# Patient Record
Sex: Female | Born: 1980 | Race: Black or African American | Hispanic: No | Marital: Married | Smoking: Never smoker
Health system: Southern US, Community
[De-identification: ages and names within clinical notes are randomized; demographics above are authoritative.]

## PROBLEM LIST (undated history)

## (undated) DIAGNOSIS — R011 Cardiac murmur, unspecified: Secondary | ICD-10-CM

## (undated) DIAGNOSIS — J302 Other seasonal allergic rhinitis: Secondary | ICD-10-CM

## (undated) HISTORY — DX: Other seasonal allergic rhinitis: J30.2

## (undated) HISTORY — DX: Cardiac murmur, unspecified: R01.1

---

## 1999-05-12 ENCOUNTER — Emergency Department (HOSPITAL_COMMUNITY): Admission: EM | Admit: 1999-05-12 | Discharge: 1999-05-12 | Payer: Self-pay | Admitting: Emergency Medicine

## 1999-05-12 ENCOUNTER — Encounter: Payer: Self-pay | Admitting: Emergency Medicine

## 2000-06-21 ENCOUNTER — Emergency Department (HOSPITAL_COMMUNITY): Admission: EM | Admit: 2000-06-21 | Discharge: 2000-06-21 | Payer: Self-pay | Admitting: Emergency Medicine

## 2000-06-21 ENCOUNTER — Encounter: Payer: Self-pay | Admitting: Emergency Medicine

## 2000-08-17 ENCOUNTER — Other Ambulatory Visit: Admission: RE | Admit: 2000-08-17 | Discharge: 2000-08-17 | Payer: Self-pay

## 2001-07-05 ENCOUNTER — Other Ambulatory Visit: Admission: RE | Admit: 2001-07-05 | Discharge: 2001-07-05 | Payer: Self-pay | Admitting: *Deleted

## 2001-12-16 ENCOUNTER — Emergency Department (HOSPITAL_COMMUNITY): Admission: EM | Admit: 2001-12-16 | Discharge: 2001-12-16 | Payer: Self-pay | Admitting: Emergency Medicine

## 2001-12-16 ENCOUNTER — Encounter: Payer: Self-pay | Admitting: Emergency Medicine

## 2004-01-18 ENCOUNTER — Emergency Department (HOSPITAL_COMMUNITY): Admission: EM | Admit: 2004-01-18 | Discharge: 2004-01-18 | Payer: Self-pay | Admitting: Emergency Medicine

## 2006-09-23 ENCOUNTER — Inpatient Hospital Stay (HOSPITAL_COMMUNITY): Admission: AD | Admit: 2006-09-23 | Discharge: 2006-09-25 | Payer: Self-pay | Admitting: Obstetrics & Gynecology

## 2006-09-23 ENCOUNTER — Encounter: Payer: Self-pay | Admitting: Obstetrics & Gynecology

## 2008-03-26 ENCOUNTER — Inpatient Hospital Stay (HOSPITAL_COMMUNITY): Admission: AD | Admit: 2008-03-26 | Discharge: 2008-03-28 | Payer: Self-pay | Admitting: Obstetrics & Gynecology

## 2008-03-26 ENCOUNTER — Ambulatory Visit: Payer: Self-pay | Admitting: Advanced Practice Midwife

## 2009-08-12 ENCOUNTER — Ambulatory Visit (HOSPITAL_COMMUNITY): Admission: RE | Admit: 2009-08-12 | Discharge: 2009-08-12 | Payer: Self-pay | Admitting: Obstetrics

## 2009-09-02 ENCOUNTER — Encounter: Payer: Self-pay | Admitting: Family

## 2009-09-02 ENCOUNTER — Ambulatory Visit: Payer: Self-pay | Admitting: Obstetrics and Gynecology

## 2009-09-02 LAB — CONVERTED CEMR LAB
Basophils Absolute: 0 10*3/uL (ref 0.0–0.1)
Basophils Relative: 0 % (ref 0–1)
Eosinophils Absolute: 0.1 10*3/uL (ref 0.0–0.7)
Eosinophils Relative: 2 % (ref 0–5)
Hepatitis B Surface Ag: NEGATIVE
Hgb A: 97 % (ref 96.8–97.8)
Hgb F Quant: 0 % (ref 0.0–2.0)
Hgb S Quant: 0 % (ref 0.0–0.0)
Lymphs Abs: 1.8 10*3/uL (ref 0.7–4.0)
Monocytes Relative: 8 % (ref 3–12)
Neutro Abs: 3.8 10*3/uL (ref 1.7–7.7)
Neutrophils Relative %: 62 % (ref 43–77)
Platelets: 225 10*3/uL (ref 150–400)
RDW: 14.3 % (ref 11.5–15.5)
Rh Type: POSITIVE

## 2009-09-09 ENCOUNTER — Ambulatory Visit: Payer: Self-pay | Admitting: Obstetrics and Gynecology

## 2009-09-16 ENCOUNTER — Encounter: Payer: Self-pay | Admitting: Family Medicine

## 2009-09-16 ENCOUNTER — Ambulatory Visit: Payer: Self-pay | Admitting: Obstetrics and Gynecology

## 2009-09-21 ENCOUNTER — Ambulatory Visit: Payer: Self-pay | Admitting: Physician Assistant

## 2009-09-21 ENCOUNTER — Inpatient Hospital Stay (HOSPITAL_COMMUNITY): Admission: AD | Admit: 2009-09-21 | Discharge: 2009-09-22 | Payer: Self-pay | Admitting: Obstetrics & Gynecology

## 2009-11-04 ENCOUNTER — Ambulatory Visit: Payer: Self-pay | Admitting: Obstetrics and Gynecology

## 2010-01-01 ENCOUNTER — Ambulatory Visit: Payer: Self-pay | Admitting: Obstetrics & Gynecology

## 2010-02-12 ENCOUNTER — Ambulatory Visit: Payer: Self-pay | Admitting: Obstetrics & Gynecology

## 2010-04-09 ENCOUNTER — Ambulatory Visit: Admit: 2010-04-09 | Payer: Self-pay | Admitting: Obstetrics & Gynecology

## 2010-04-25 ENCOUNTER — Encounter: Payer: Self-pay | Admitting: *Deleted

## 2010-04-28 ENCOUNTER — Other Ambulatory Visit: Payer: Self-pay | Admitting: Obstetrics and Gynecology

## 2010-04-28 ENCOUNTER — Ambulatory Visit
Admission: RE | Admit: 2010-04-28 | Discharge: 2010-04-28 | Payer: Self-pay | Source: Home / Self Care | Attending: Obstetrics and Gynecology | Admitting: Obstetrics and Gynecology

## 2010-04-29 NOTE — Progress Notes (Unsigned)
NAME:  Andrea Mcdaniel, Andrea Mcdaniel NO.:  [id number]  MEDICAL RECORD NO.:  [id number]          PATIENT TYPE:  WOC  LOCATION:  [facility]                   FACILITY:  [facility]  PHYSICIAN:  Argentina Donovan, MD        DATE OF BIRTH:  [birth year]  DATE OF SERVICE:  04/28/2010                                 CLINIC NOTE  The patient is a 30 year old African American female, gravida 3, para 3- 0-0-3, youngest baby 71 months old, had an IUD Mirena put in September of this past year, working fine, she is having no problems, no complaints and came back in today for routine Pap smear.  Pap smear was done without incident.  External genitalia was normal.  BUS within normal limits.  Vagina is clean and well rugated.  Cervix clean, parous, and the IUD string could be easily identified.  The uterus and adnexa could not be felt because habitus of the patient.  IMPRESSION:  Repeat Pap smear done.          ______________________________ Argentina Donovan, MD    PR/MEDQ  D:  04/28/2010  T:  04/29/2010  Job:  [id number]

## 2010-06-20 LAB — CBC
HCT: 34.6 % — ABNORMAL LOW (ref 36.0–46.0)
Hemoglobin: 11.6 g/dL — ABNORMAL LOW (ref 12.0–15.0)
MCHC: 33.5 g/dL (ref 30.0–36.0)
MCV: 81.4 fL (ref 78.0–100.0)
Platelets: 263 10*3/uL (ref 150–400)
RBC: 4.25 MIL/uL (ref 3.87–5.11)
WBC: 9 10*3/uL (ref 4.0–10.5)

## 2010-06-20 LAB — POCT URINALYSIS DIP (DEVICE): pH: 6 (ref 5.0–8.0)

## 2010-06-21 LAB — POCT URINALYSIS DIP (DEVICE)
Glucose, UA: NEGATIVE mg/dL
Ketones, ur: 15 mg/dL — AB
Nitrite: NEGATIVE
Protein, ur: NEGATIVE mg/dL
Urobilinogen, UA: 1 mg/dL (ref 0.0–1.0)
Urobilinogen, UA: 1 mg/dL (ref 0.0–1.0)
pH: 6.5 (ref 5.0–8.0)

## 2010-08-17 NOTE — Assessment & Plan Note (Signed)
NAME:  Andrea Mcdaniel, Andrea Mcdaniel NO.:  [id number]   MEDICAL RECORD NO.:  [id number]          PATIENT TYPE:  WOC   LOCATION:  [facility]         FACILITY:  [facility]   PHYSICIAN:  Caren Griffins, CNM       DATE OF BIRTH:  Aug 09, 1980   DATE OF SERVICE:                                  CLINIC NOTE   HISTORY OF PRESENT ILLNESS:  Andrea Mcdaniel is a 30 year old, G3, P3-0-0-  3.  She is here for her 6-week postpartum checkup.  She had a 7 pound 1  ounce baby at 41 weeks.  She has no complaints, and she has decreased  lochia and believes she is getting her first period since child birth on  October 26, 2009.  She is currently bleeding for the past 9 days, this is  not worrisome.  She is using tampons.  She has not resumed having sex.  She had a Depo-Provera shot at the hospital after child birth, and she  is planning on using the Mirena contraception once she gets her  insurance situated, and she will make an appointment to get that in the  future.  She denies depressed feelings.  She is currently breast feeding  and also bottle feeding, and she is successfully doing both.  She is not  having tenderness of her breasts.  She denies fever.  Denies pain or  foul discharge.  She is enjoying motherhood and is very pleased with her  situation 6 weeks postpartum.  Her last Pap was January 2011, and she is  aware that the Pap was normal.   OBJECTIVE:  VITAL SIGNS:  Temperature 97.5, pulse 95, blood pressure  121/74, and weight 215.3 pounds, 97.7 kg.  GENERAL:  She appears well nourished and pleasant.  She is well  developed.  She is alert and oriented x4.  SKIN:  Warm and dry with no rashes or lesions.  NECK:  Full range of motion.  Lymph nodes were not palpable.  Thyroid,  no obvious deformities.  BREASTS:  Deferred.  CARDIO:  Regular rate and rhythm.  LUNGS:  Clear to auscultation.  No rhonchi.  No wheezes.  EXTREMITIES:  Obvious deformities.  Peripheral pulses were +2.  No signs  of  edema.  GENITOURINARY:  Uterus was slightly enlarged, but not worrisome.  Bimanual speculum exam showed bleeding, but cervix was normal.  Bleeding  was not worrisome.   ASSESSMENT:  A 30 year old G3, P3-0-0-3 woman, 6 weeks postpartum, comes  in with no concerns, who is improving since delivery.  The plan is for  her to get Mirena when she gets her insurance situated and at that time  to also check in to getting a Pap, to continue breast and formula  feeding and get successful, and to follow up if any concerns with  continued bleeding or vaginal discharge.           ______________________________  Caren Griffins, CNM     DP/MEDQ  D:  11/04/2009  T:  11/05/2009  Job:  [id number]

## 2011-01-07 LAB — RPR: RPR Ser Ql: NONREACTIVE

## 2011-01-07 LAB — CBC
HCT: 31.9 % — ABNORMAL LOW (ref 36.0–46.0)
Hemoglobin: 10.5 g/dL — ABNORMAL LOW (ref 12.0–15.0)
MCV: 81.4 fL (ref 78.0–100.0)
RBC: 3.92 MIL/uL (ref 3.87–5.11)
RDW: 14.4 % (ref 11.5–15.5)

## 2011-01-07 LAB — URINE MICROSCOPIC-ADD ON

## 2011-01-07 LAB — URINALYSIS, ROUTINE W REFLEX MICROSCOPIC
Glucose, UA: NEGATIVE mg/dL
Nitrite: NEGATIVE

## 2011-01-07 LAB — HCG, QUANTITATIVE, PREGNANCY: hCG, Beta Chain, Quant, S: 6448 m[IU]/mL — ABNORMAL HIGH (ref <5)

## 2011-01-07 LAB — ABO/RH: ABO/RH(D): B POS

## 2011-01-07 LAB — RUBELLA SCREEN: Rubella: 33.9 IU/mL — ABNORMAL HIGH

## 2011-01-07 LAB — ANTIBODY SCREEN: Antibody Screen: NEGATIVE

## 2011-01-07 LAB — RAPID HIV SCREEN (WH-MAU): Rapid HIV Screen: NONREACTIVE

## 2011-01-19 LAB — CBC
HCT: 31.1 — ABNORMAL LOW
HCT: 35.4 — ABNORMAL LOW
Hemoglobin: 11.8 — ABNORMAL LOW
MCHC: 33.1
MCV: 84.7
Platelets: 263
RBC: 4.21
RDW: 14.1 — ABNORMAL HIGH
WBC: 24.1 — ABNORMAL HIGH

## 2011-01-19 LAB — RAPID URINE DRUG SCREEN, HOSP PERFORMED
Barbiturates: NOT DETECTED
Cocaine: NOT DETECTED
Opiates: NOT DETECTED

## 2011-01-19 LAB — RAPID HIV SCREEN (WH-MAU): Rapid HIV Screen: NONREACTIVE

## 2011-01-19 LAB — ABO/RH: ABO/RH(D): B POS

## 2011-01-19 LAB — HEMOGLOBINOPATHY EVALUATION: Hgb A2 Quant: 3.2

## 2011-01-19 LAB — RUBELLA SCREEN: Rubella: 33.2 — ABNORMAL HIGH

## 2011-01-19 LAB — DIFFERENTIAL
Eosinophils Relative: 0
Monocytes Absolute: 1.7 — ABNORMAL HIGH
Monocytes Relative: 7
Neutrophils Relative %: 90 — ABNORMAL HIGH

## 2011-01-19 LAB — HEPATITIS B SURFACE ANTIGEN: Hepatitis B Surface Ag: NEGATIVE

## 2011-01-19 LAB — TYPE AND SCREEN

## 2011-10-03 IMAGING — US US OB COMP +14 WK
1 series · 14 of 28 positions shown · non-contrast
Comparison: none

OBSTETRICAL ULTRASOUND:
 This ultrasound exam was performed in the [HOSPITAL] Ultrasound Department.  The OB US report was generated in the AS system, and faxed to the ordering physician.  This report is also available in [HOSPITAL]?s Access[employer]ware and in [REDACTED] PACS.

[Series 1: us ob comp less 14 wks · 49 acquisitions, 14 frames shown]
[im 2/49]
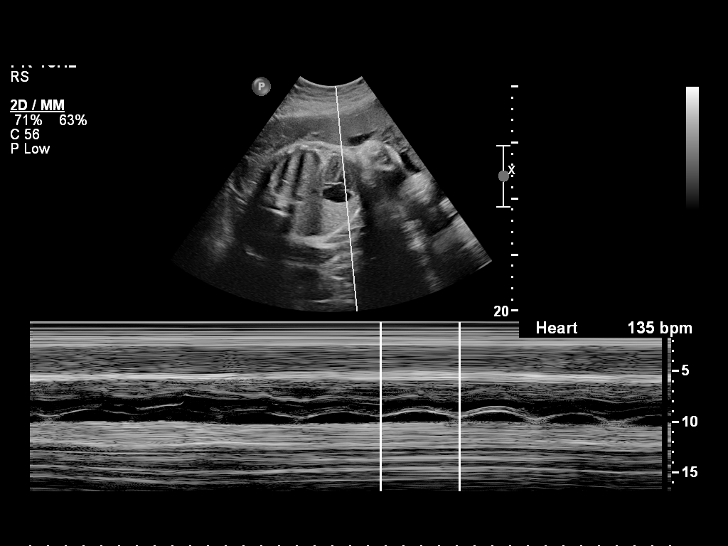
[im 6/49]
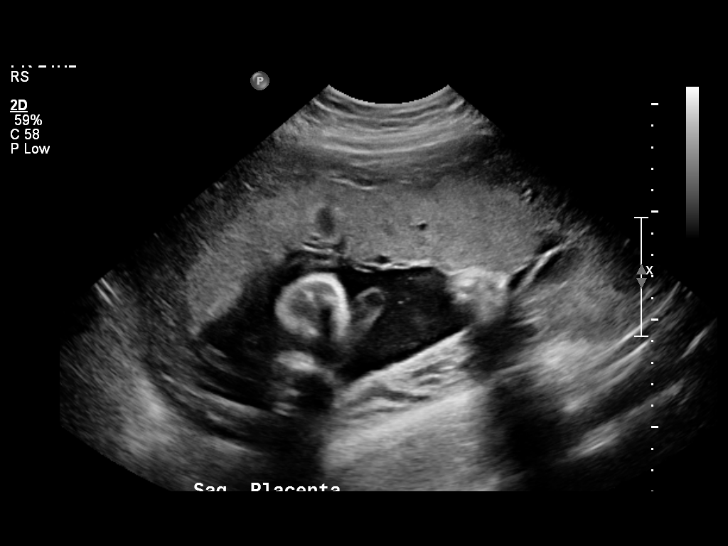
[im 9/49]
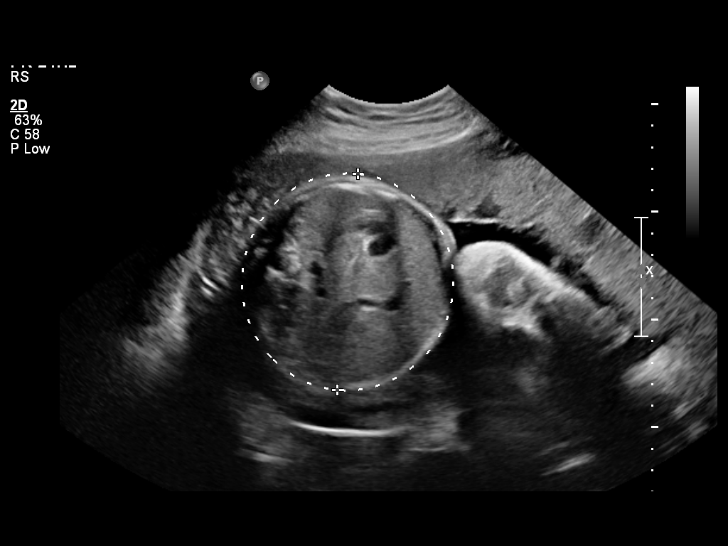
[im 13/49]
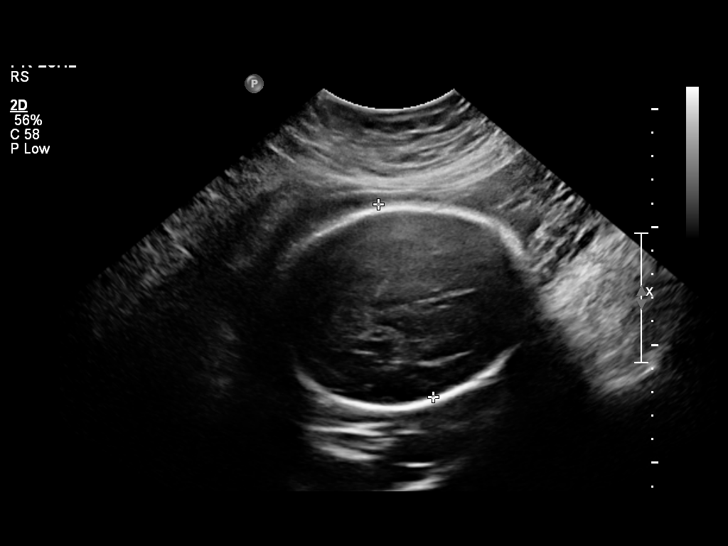
[im 17/49]
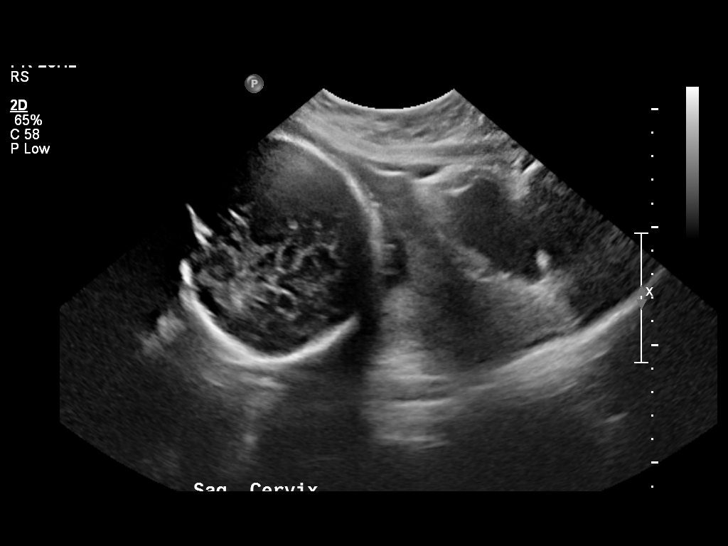
[im 20/49]
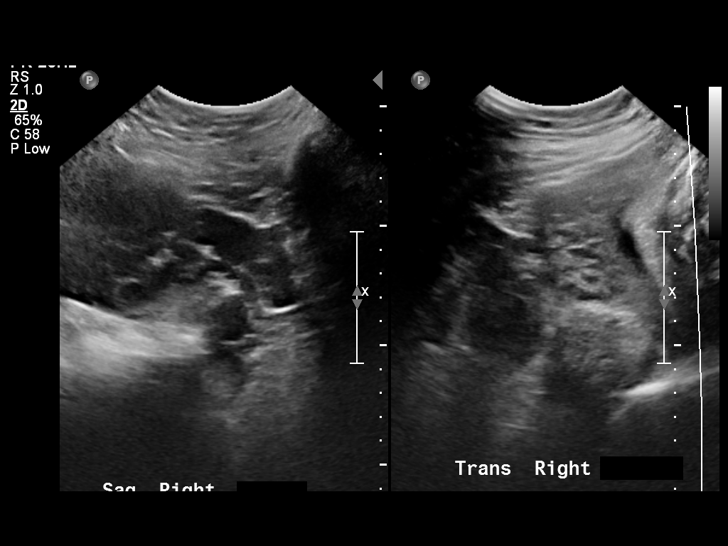
[im 24/49]
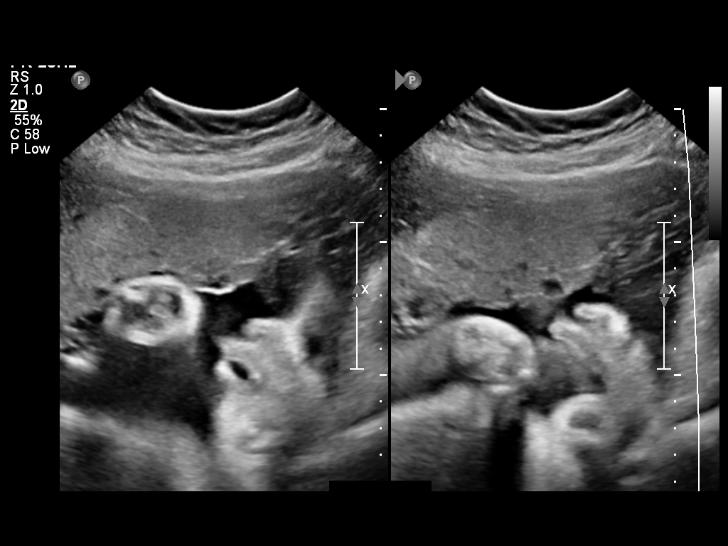
[im 27/49]
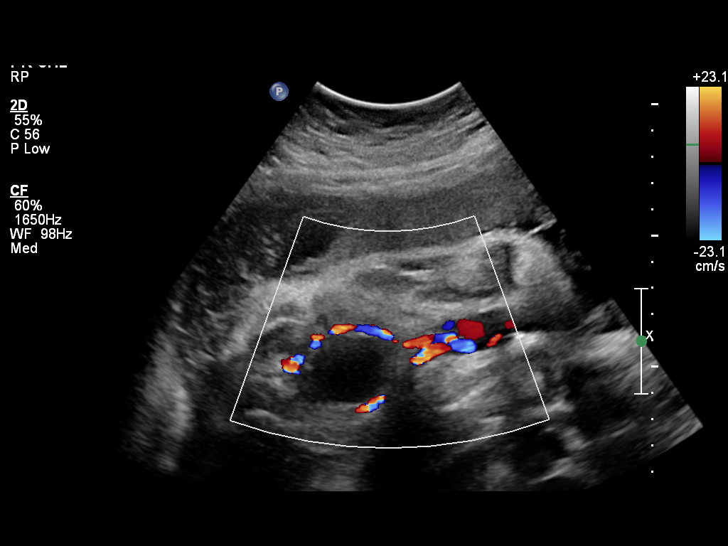
[im 31/49]
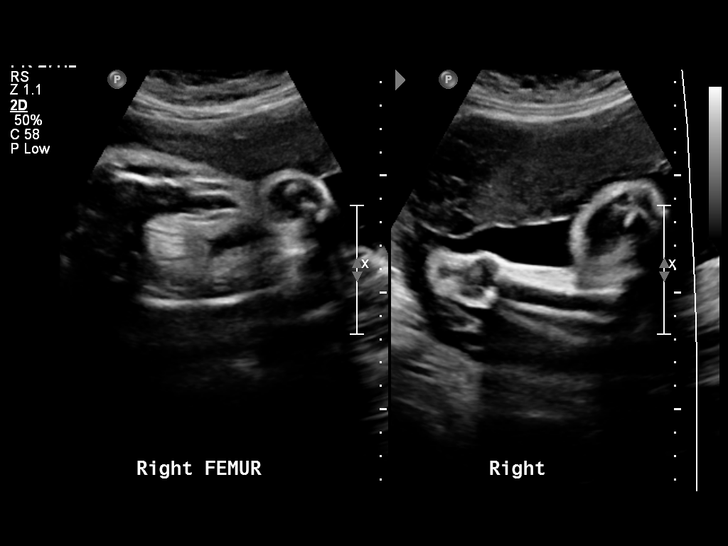
[im 34/49]
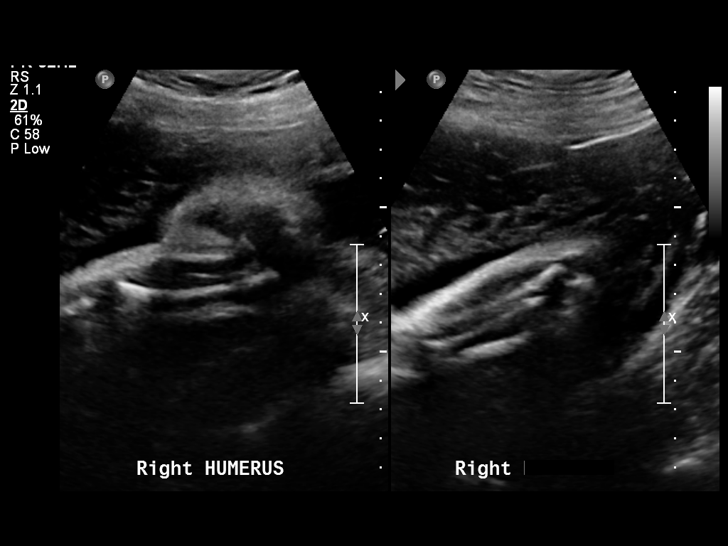
[im 38/49]
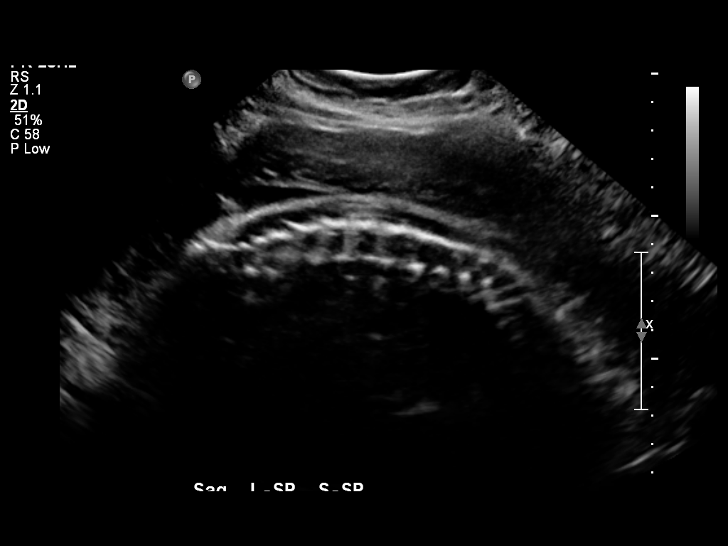
[im 41/49]
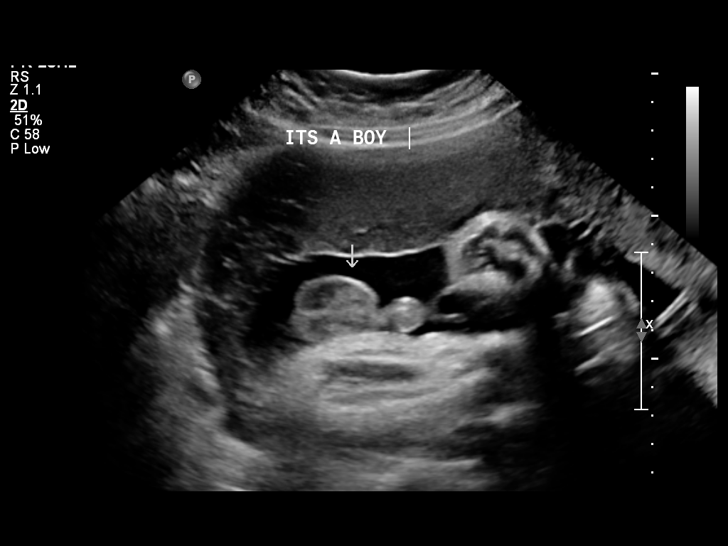
[im 45/49]
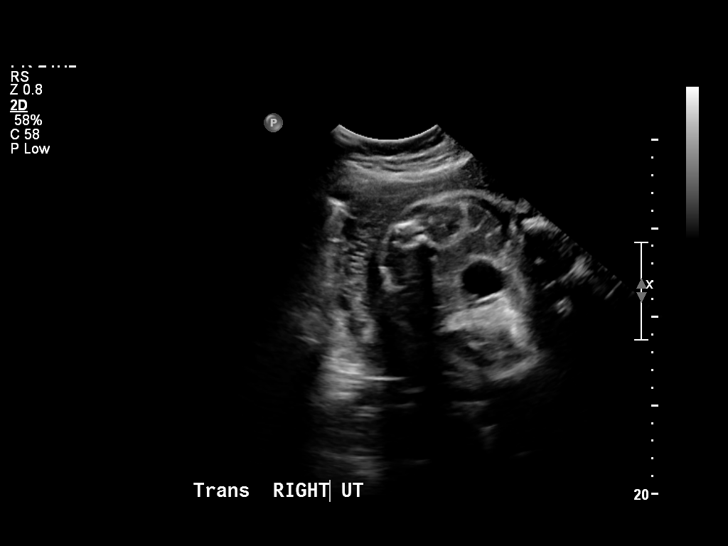
[im 49/49]
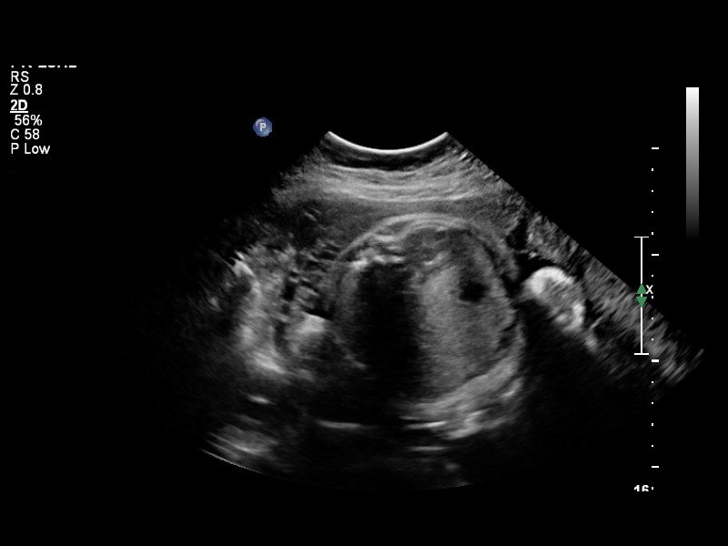

[14 of 28 positions shown; findings below may reference images not displayed]

IMPRESSION: See AS Obstetric US report.

## 2011-11-18 ENCOUNTER — Encounter: Payer: Self-pay | Admitting: Advanced Practice Midwife

## 2011-11-18 ENCOUNTER — Ambulatory Visit (INDEPENDENT_AMBULATORY_CARE_PROVIDER_SITE_OTHER): Payer: No Typology Code available for payment source | Admitting: Advanced Practice Midwife

## 2011-11-18 VITALS — BP 118/75 | HR 76 | Temp 98.2°F | Ht 63.0 in | Wt 246.0 lb

## 2011-11-18 DIAGNOSIS — Z01419 Encounter for gynecological examination (general) (routine) without abnormal findings: Secondary | ICD-10-CM

## 2011-11-18 DIAGNOSIS — Z975 Presence of (intrauterine) contraceptive device: Secondary | ICD-10-CM | POA: Insufficient documentation

## 2011-11-18 NOTE — Patient Instructions (Signed)

## 2011-11-18 NOTE — Progress Notes (Signed)
  Subjective:     Andrea Mcdaniel is a 31 y.o. female here for a routine exam.  Current complaints: none.  Takes MVI and Calcium every day. Has a personal physician for annual health screening.  Personal health questionnaire reviewed: no.   Gynecologic History Patient's last menstrual period was 10/24/2011. Contraception: IUD Last Pap: 2011. Results were: normal Last mammogram:   Obstetric History OB History    Grav Para Term Preterm Abortions TAB SAB Ect Mult Living   3 3 2 1  0 0 0 0 0 3     # Outc Date GA Lbr Len/2nd Wgt Sex Del Anes PTL Lv   1 PRE 6/08 [redacted]w[redacted]d  3lb6.3oz(1.539kg) F SVD      2 TRM 12/09 [redacted]w[redacted]d  7lb8oz(3.402kg) M SVD      3 TRM 6/11 [redacted]w[redacted]d  7lb10oz(3.459kg) M SVD EPI         The following portions of the patient's history were reviewed and updated as appropriate: allergies, current medications, past family history, past medical history, past social history, past surgical history and problem list.  Review of Systems Pertinent items are noted in HPI.    Objective:    General appearance: alert, cooperative, appears stated age and no distress Back: no kyphosis present, no scoliosis present, no skin lesions, erythema, or scars, no tenderness to percussion or palpation, range of motion normal, symmetric, no curvature. ROM normal. No CVA tenderness. Breasts: normal appearance, no masses or tenderness, Inspection negative, Normal to palpation without dominant masses Abdomen: soft, non-tender; bowel sounds normal; no masses,  no organomegaly Pelvic: cervix normal in appearance, external genitalia normal, no adnexal masses or tenderness, no cervical motion tenderness, rectovaginal septum normal, uterus normal size, shape, and consistency and vagina normal without discharge    Assessment:    Healthy female exam.  History of past normal pap   Plan:    Education reviewed: calcium supplements, low fat, low cholesterol diet, self breast exams and weight bearing  exercise. Contraception: IUD. Follow up in: 1 year. Will need Pap next year

## 2012-08-20 ENCOUNTER — Ambulatory Visit: Payer: No Typology Code available for payment source | Admitting: Family Medicine

## 2012-10-15 ENCOUNTER — Ambulatory Visit (INDEPENDENT_AMBULATORY_CARE_PROVIDER_SITE_OTHER): Payer: PRIVATE HEALTH INSURANCE | Admitting: Family Medicine

## 2012-10-15 ENCOUNTER — Encounter: Payer: Self-pay | Admitting: Family Medicine

## 2012-10-15 VITALS — BP 120/78 | HR 80 | Temp 98.0°F | Ht 63.25 in | Wt 257.0 lb

## 2012-10-15 DIAGNOSIS — Z136 Encounter for screening for cardiovascular disorders: Secondary | ICD-10-CM

## 2012-10-15 DIAGNOSIS — Z0000 Encounter for general adult medical examination without abnormal findings: Secondary | ICD-10-CM

## 2012-10-15 DIAGNOSIS — E669 Obesity, unspecified: Secondary | ICD-10-CM | POA: Insufficient documentation

## 2012-10-15 LAB — CBC WITH DIFFERENTIAL/PLATELET
Basophils Absolute: 0 10*3/uL (ref 0.0–0.1)
Basophils Relative: 0.4 % (ref 0.0–3.0)
Hemoglobin: 13 g/dL (ref 12.0–15.0)
Lymphocytes Relative: 40 % (ref 12.0–46.0)
Lymphs Abs: 2.4 10*3/uL (ref 0.7–4.0)
MCHC: 33 g/dL (ref 30.0–36.0)
Monocytes Absolute: 0.4 10*3/uL (ref 0.1–1.0)
Monocytes Relative: 6.7 % (ref 3.0–12.0)
Neutro Abs: 3 10*3/uL (ref 1.4–7.7)
RBC: 4.61 Mil/uL (ref 3.87–5.11)

## 2012-10-15 LAB — COMPREHENSIVE METABOLIC PANEL
ALT: 16 U/L (ref 0–35)
CO2: 27 mEq/L (ref 19–32)
Creatinine, Ser: 0.8 mg/dL (ref 0.4–1.2)
GFR: 114.73 mL/min (ref >60.00)
Total Bilirubin: 0.4 mg/dL (ref 0.3–1.2)

## 2012-10-15 LAB — LIPID PANEL
Cholesterol: 113 mg/dL (ref 0–200)
Triglycerides: 57 mg/dL (ref 0.0–149.0)

## 2012-10-15 MED ORDER — PHENTERMINE HCL 15 MG PO CAPS: 15.0000 mg | ORAL_CAPSULE | ORAL | Status: DC

## 2012-10-15 NOTE — Patient Instructions (Addendum)
It was so nice to meet you. We will call you with your lab results.  You can also view them online.  We are starting phentermine 15 mg daily.  Please follow up with me in 1 month.  Please stop by to see Shirlee Limerick on your way out to set up yournutritionist referral.

## 2012-10-15 NOTE — Progress Notes (Signed)
Subjective:    Patient ID: Andrea Mcdaniel, female    DOB: 05/10/1980, 32 y.o.   MRN: [id number]  HPI Very pleasant 32 yo female here to establish care.  She has been seeing OBGYN- has Mirena IUD- but would like to transfer all care here.  Obesity- has been an issue for years but feels like it has been the worst since she gave birth to her now 33 year old. She is at her heaviest weight now. Walks 1 mile three times a week. She admits to snacking after work while making dinner and often making poor food choices.  She denies any symptoms of hypo or hyperthyroidism.  She has tried juicing in past, no other commercial diet plans.  Patient Active Problem List   Diagnosis Date Noted  . Obesity, unspecified 10/15/2012  . Routine general medical examination at a health care facility 10/15/2012  . IUD (intrauterine device) in place 11/18/2011   Past Medical History  Diagnosis Date  . Heart murmur    No past surgical history on file. History  Substance Use Topics  . Smoking status: Never Smoker   . Smokeless tobacco: Never Used  . Alcohol Use: No   Family History  Problem Relation Age of Onset  . Hypertension Mother   . Hypertension Father   . Diabetes Maternal Aunt   . Hypertension Maternal Aunt   . Cancer Maternal Aunt    No Known Allergies Current Outpatient Prescriptions on File Prior to Visit  Medication Sig Dispense Refill  . levonorgestrel (MIRENA) 20 MCG/24HR IUD 1 each by Intrauterine route once.      . Multiple Vitamins-Minerals (MULTIVITAMIN WITH MINERALS) tablet Take 1 tablet by mouth daily.       No current facility-administered medications on file prior to visit.   The PMH, PSH, Social History, Family History, Medications, and allergies have been reviewed in [facility], and have been updated if relevant.    Review of Systems See HPI No CP No SOB No changes in her bowel habits No blood in stool No anxiety or depression    Objective:   Physical Exam BP 120/78   Pulse 80  Temp(Src) 98 F (36.7 C)  Ht 5' 3.25" (1.607 m)  Wt 257 lb (116.574 kg)  BMI 45.14 kg/m2  General:  Obese, Well-developed,well-nourished,in no acute distress; alert,appropriate and cooperative throughout examination Head:  normocephalic and atraumatic.   Eyes:  vision grossly intact, pupils equal, pupils round, and pupils reactive to light.   Ears:  R ear normal and L ear normal.   Nose:  no external deformity.   Mouth:  good dentition.   Lungs:  Normal respiratory effort, chest expands symmetrically. Lungs are clear to auscultation, no crackles or wheezes. Heart:  Normal rate and regular rhythm. S1 and S2 normal without gallop, murmur, click, rub or other extra sounds. Abdomen:  Bowel sounds positive,abdomen soft and non-tender without masses, organomegaly or hernias noted. Msk:  No deformity or scoliosis noted of thoracic or lumbar spine.   Extremities:  No clubbing, cyanosis, edema, or deformity noted with normal full range of motion of all joints.   Neurologic:  alert & oriented X3 and gait normal.   Skin:  Intact without suspicious lesions or rashes Cervical Nodes:  No lymphadenopathy noted Axillary Nodes:  No palpable lymphadenopathy Psych:  Cognition and judgment appear intact. Alert and cooperative with normal attention span and concentration. No apparent delusions, illusions, hallucinations     Assessment & Plan:  1. Obesity, unspecified Deteriorated. Discussed  tx options at length.  She is willing to see nutritionist- referral placed. Also discussed risks and benefits of phentermine and she would like to try it. Will start 15 mg daily dosage.  Follow up in one month. - Amb ref to Medical Nutrition Therapy-MNT - TSH - Comprehensive metabolic panel - CBC with Differential - Lipid Panel

## 2012-10-16 ENCOUNTER — Encounter: Payer: Self-pay | Admitting: Family Medicine

## 2012-11-16 ENCOUNTER — Encounter: Payer: Self-pay | Admitting: Family Medicine

## 2012-11-16 ENCOUNTER — Ambulatory Visit (INDEPENDENT_AMBULATORY_CARE_PROVIDER_SITE_OTHER): Payer: PRIVATE HEALTH INSURANCE | Admitting: Family Medicine

## 2012-11-16 VITALS — BP 110/68 | HR 108 | Temp 98.1°F | Wt 250.5 lb

## 2012-11-16 DIAGNOSIS — E669 Obesity, unspecified: Secondary | ICD-10-CM

## 2012-11-16 MED ORDER — PHENTERMINE HCL 30 MG PO CAPS: 30.0000 mg | ORAL_CAPSULE | ORAL | Status: DC

## 2012-11-16 NOTE — Progress Notes (Signed)
Subjective:    Patient ID: Andrea Mcdaniel, female    DOB: 08-Sep-1980, 32 y.o.   MRN: [id number]  HPI Very pleasant 32 yo female who established care with me last month here for one month follow up obesity.     Obesity- has been an issue for years but feels like it has been the worst since she gave birth to her now 32 year old. She is at her heaviest weight now. Walks 1 mile three times a week.  She denies any symptoms of hypo or hyperthyroidism. Lab Results  Component Value Date   TSH 1.01 10/15/2012    Last month, I referred her to nutritionist and started her on phentermine 15 mg daily.  She has lost 11 pounds according our scales! Denies any CP, palpitations, SOB or insomnia.  She feels great!  Has more energy.  Has not yet seen nutritionist.  Wt Readings from Last 3 Encounters:  10/15/12 257 lb (116.574 kg)  11/18/11 246 lb (111.585 kg)    Patient Active Problem List   Diagnosis Date Noted  . Obesity, unspecified 10/15/2012  . Routine general medical examination at a health care facility 10/15/2012  . IUD (intrauterine device) in place 11/18/2011   Past Medical History  Diagnosis Date  . Heart murmur    No past surgical history on file. History  Substance Use Topics  . Smoking status: Never Smoker   . Smokeless tobacco: Never Used  . Alcohol Use: No   Family History  Problem Relation Age of Onset  . Hypertension Mother   . Hypertension Father   . Diabetes Maternal Aunt   . Hypertension Maternal Aunt   . Cancer Maternal Aunt    No Known Allergies Current Outpatient Prescriptions on File Prior to Visit  Medication Sig Dispense Refill  . levonorgestrel (MIRENA) 20 MCG/24HR IUD 1 each by Intrauterine route once.      . Multiple Vitamins-Minerals (MULTIVITAMIN WITH MINERALS) tablet Take 1 tablet by mouth daily.      . phentermine 15 MG capsule Take 1 capsule (15 mg total) by mouth every morning.  30 capsule  0   No current facility-administered medications on  file prior to visit.   The PMH, PSH, Social History, Family History, Medications, and allergies have been reviewed in [facility], and have been updated if relevant.    Review of Systems See HPI No CP No SOB     Objective:   Physical Exam BP 110/68  Pulse 108  Temp(Src) 98.1 F (36.7 C) (Oral)  Wt 250 lb 8 oz (113.626 kg)  BMI 44 kg/m2  SpO2 98%  LMP 10/27/2012  General:  Obese, Well-developed,well-nourished,in no acute distress; alert,appropriate and cooperative throughout examination Head:  normocephalic and atraumatic.   Eyes:  vision grossly intact, pupils equal, pupils round, and pupils reactive to light.   Ears:  R ear normal and L ear normal.   Nose:  no external deformity.   Mouth:  good dentition.   Lungs:  Normal respiratory effort, chest expands symmetrically. Lungs are clear to auscultation, no crackles or wheezes. Heart:  Normal rate and regular rhythm. S1 and S2 normal without gallop, murmur, click, rub or other extra sounds. Psych:  Cognition and judgment appear intact. Alert and cooperative with normal attention span and concentration. No apparent delusions, illusions, hallucinations     Assessment & Plan:  1. Obesity, unspecified Doing remarkably well. She would like to try increased dose of phentermine- will increase to 30 mg daily. Follow up in  1 month. The patient indicates understanding of these issues and agrees with the plan.

## 2012-11-16 NOTE — Patient Instructions (Addendum)
So good to see you.  I am so proud of you! We are increasing your phentermine to 30 mg every morning.  Come see me in 1 month.

## 2012-12-21 ENCOUNTER — Ambulatory Visit: Payer: PRIVATE HEALTH INSURANCE | Admitting: Family Medicine

## 2013-01-11 ENCOUNTER — Ambulatory Visit (INDEPENDENT_AMBULATORY_CARE_PROVIDER_SITE_OTHER): Payer: PRIVATE HEALTH INSURANCE | Admitting: Family Medicine

## 2013-01-11 ENCOUNTER — Ambulatory Visit: Payer: PRIVATE HEALTH INSURANCE | Admitting: *Deleted

## 2013-01-11 ENCOUNTER — Encounter: Payer: Self-pay | Admitting: Family Medicine

## 2013-01-11 VITALS — BP 118/82 | HR 82 | Temp 97.7°F | Wt 249.8 lb

## 2013-01-11 DIAGNOSIS — Z23 Encounter for immunization: Secondary | ICD-10-CM

## 2013-01-11 DIAGNOSIS — E669 Obesity, unspecified: Secondary | ICD-10-CM

## 2013-01-11 MED ORDER — PHENTERMINE HCL 30 MG PO CAPS: 30.0000 mg | ORAL_CAPSULE | ORAL | Status: DC

## 2013-01-11 NOTE — Patient Instructions (Signed)
Good to see you. If you check your blood pressure at home, then ok to skip monthly visits to refill phentermine. Please let me know if you have palpitations, chest pain, or shortness of breath.

## 2013-01-11 NOTE — Progress Notes (Signed)
  Subjective:    Patient ID: Andrea Mcdaniel, female    DOB: June 12, 1980, 32 y.o.   MRN: [id number]  HPI Very pleasant 32 yo female who established care with me in July 2014 here to follow up obesity.     Obesity- has been an issue for years but she is making positive changes.  Tolerating phentermine 30 mg daily and would like to continue this.  No CP, palpitations, SOB or DOE.  Met with nutritionist this morning and felt it went well.  Has another appt scheduled with her in 02/2013.  Walking 1 mile twice weekly.  Lab Results  Component Value Date   TSH 1.01 10/15/2012    Wt Readings from Last 3 Encounters:  01/11/13 249 lb 12 oz (113.286 kg)  11/16/12 250 lb 8 oz (113.626 kg)  10/15/12 257 lb (116.574 kg)     Patient Active Problem List   Diagnosis Date Noted  . Obesity, unspecified 10/15/2012  . IUD (intrauterine device) in place 11/18/2011   Past Medical History  Diagnosis Date  . Heart murmur    No past surgical history on file. History  Substance Use Topics  . Smoking status: Never Smoker   . Smokeless tobacco: Never Used  . Alcohol Use: No   Family History  Problem Relation Age of Onset  . Hypertension Mother   . Hypertension Father   . Diabetes Maternal Aunt   . Hypertension Maternal Aunt   . Cancer Maternal Aunt    No Known Allergies Current Outpatient Prescriptions on File Prior to Visit  Medication Sig Dispense Refill  . levonorgestrel (MIRENA) 20 MCG/24HR IUD 1 each by Intrauterine route once.      . Multiple Vitamins-Minerals (MULTIVITAMIN WITH MINERALS) tablet Take 1 tablet by mouth daily.      . phentermine 30 MG capsule Take 1 capsule (30 mg total) by mouth every morning.  30 capsule  0   No current facility-administered medications on file prior to visit.   The PMH, PSH, Social History, Family History, Medications, and allergies have been reviewed in [facility], and have been updated if relevant.    Review of Systems See HPI No CP No SOB      Objective:   Physical Exam BP 118/82  Pulse 82  Temp(Src) 97.7 F (36.5 C) (Oral)  Wt 249 lb 12 oz (113.286 kg)  BMI 43.87 kg/m2  SpO2 97%  LMP 12/12/2012  General:  Obese, Well-developed,well-nourished,in no acute distress; alert,appropriate and cooperative throughout examination Head:  normocephalic and atraumatic.   Eyes:  vision grossly intact, pupils equal, pupils round, and pupils reactive to light.   Ears:  R ear normal and L ear normal.   Nose:  no external deformity.   Mouth:  good dentition.   Lungs:  Normal respiratory effort, chest expands symmetrically. Lungs are clear to auscultation, no crackles or wheezes. Heart:  Normal rate and regular rhythm. S1 and S2 normal without gallop, murmur, click, rub or other extra sounds. Psych:  Cognition and judgment appear intact. Alert and cooperative with normal attention span and concentration. No apparent delusions, illusions, hallucinations     Assessment & Plan:  1. Obesity, unspecified Doing remarkably well. Congratulated her on positive life style changes. Rx given to pt for phentermine. Continue exercise, nutrition counseling. The patient indicates understanding of these issues and agrees with the plan.

## 2013-01-11 NOTE — Addendum Note (Signed)
Addended by: Criselda Peaches B on: 01/11/2013 03:21 PM   Modules accepted: Orders

## 2013-02-15 ENCOUNTER — Ambulatory Visit: Payer: PRIVATE HEALTH INSURANCE | Admitting: *Deleted

## 2013-02-15 NOTE — Progress Notes (Deleted)
  Medical Nutrition Therapy:  Appt start time: 0930 end time:  {Time; Appointment:21385}.   Assessment:  Primary concerns today: ***.   Preferred Learning Style: ***  Auditory  Visual  Hands on  No preference indicated   Learning Readiness: ***  Not ready  Contemplating  Ready  Change in progress   MEDICATIONS: ***   DIETARY INTAKE:  *** does the shopping and cooking for the home.  Typical meal includes ***.  Usual eating pattern includes *** meals and *** snacks per day.  Eats out *** times per week out of 21 meals at places like ***, where a typical meal includes ***.  Everyday foods include ***.  Avoided foods include ***.    24-hr recall:  B ( AM): ***  Snk ( AM): ***  L ( PM): *** Snk ( PM): *** D ( PM): *** Snk ( PM): *** Beverages: ***  Usual physical activity: ***  Estimated energy needs: *** calories *** g carbohydrates *** g protein *** g fat   Nutritional Diagnosis:  {CHL AMB NUTRITIONAL DIAGNOSIS:[phone]}    Intervention:  Nutrition ***.  Teaching Method Utilized: *** Visual Auditory Hands on  Handouts given during visit include:  ***  ***  Goals:  Eat 3 meals/day, Avoid meal skipping   Increase protein rich foods  Follow "Plate Method" for portion control  Limit carbohydrate1-2 servings/meal   Choose more whole grains, lean protein, low-fat dairy, and fruits/non-starchy vegetables.   Aim for >30 min of physical activity daily  Limit sugar-sweetened beverages and concentrated sweets  Barriers to learning/adherence to lifestyle change: ***  Demonstrated degree of understanding via:  Teach Back   Monitoring/Evaluation:  Dietary intake, exercise, ***, and body weight {follow up:15908}.

## 2013-02-21 ENCOUNTER — Encounter: Payer: Self-pay | Admitting: Family Medicine

## 2013-02-21 ENCOUNTER — Other Ambulatory Visit: Payer: Self-pay | Admitting: Family Medicine

## 2013-02-21 MED ORDER — FLUCONAZOLE 150 MG PO TABS: 150.0000 mg | ORAL_TABLET | Freq: Once | ORAL | Status: DC

## 2013-02-22 ENCOUNTER — Other Ambulatory Visit: Payer: Self-pay | Admitting: Internal Medicine

## 2014-02-03 ENCOUNTER — Encounter: Payer: Self-pay | Admitting: Family Medicine

## 2014-05-22 ENCOUNTER — Ambulatory Visit: Payer: PRIVATE HEALTH INSURANCE | Admitting: Family Medicine

## 2014-05-28 ENCOUNTER — Ambulatory Visit (INDEPENDENT_AMBULATORY_CARE_PROVIDER_SITE_OTHER): Payer: PRIVATE HEALTH INSURANCE | Admitting: Family Medicine

## 2014-05-28 ENCOUNTER — Encounter: Payer: Self-pay | Admitting: Family Medicine

## 2014-05-28 VITALS — BP 124/62 | HR 71 | Temp 97.9°F | Wt 252.2 lb

## 2014-05-28 DIAGNOSIS — Z975 Presence of (intrauterine) contraceptive device: Secondary | ICD-10-CM

## 2014-05-28 DIAGNOSIS — E669 Obesity, unspecified: Secondary | ICD-10-CM

## 2014-05-28 DIAGNOSIS — R21 Rash and other nonspecific skin eruption: Secondary | ICD-10-CM | POA: Insufficient documentation

## 2014-05-28 MED ORDER — FLUOCINONIDE-E 0.05 % EX CREA: 1.0000 "application " | TOPICAL_CREAM | Freq: Two times a day (BID) | CUTANEOUS | Status: DC

## 2014-05-28 MED ORDER — PHENTERMINE HCL 30 MG PO CAPS: 30.0000 mg | ORAL_CAPSULE | ORAL | Status: DC

## 2014-05-28 NOTE — Progress Notes (Signed)
Subjective:   Patient ID: Andrea Mcdaniel, female    DOB: 01/16/1981, 34 y.o.   MRN: [id number]  Andrea Mcdaniel is a pleasant 34 y.o. year old female who presents to clinic today with Rash and Weight Gain  on 05/28/2014  HPI:  Obesity- wants to restart phentermine.  Tolerated it very well. Multiple deaths in the family, "got off track" with her diet. Motivated to eat better and exercise more.  Wt Readings from Last 3 Encounters:  05/28/14 252 lb 4 oz (114.42 kg)  01/11/13 249 lb 12 oz (113.286 kg)  11/16/12 250 lb 8 oz (113.626 kg)   Rash- very itchy on her right lower back.  Started as very small area a couple of weeks ago and has grown in size.  No one else in family has similar lesions.  They do have one dog.  Recently changed fabric softener just prior to rash starting. Never had a rash like this before.  IUD- she wonders if it is time to get a new one.  She really likes her IUD- periods are much lighter.  Was placed by GYN in hospital in 12/2009.  Current Outpatient Prescriptions on File Prior to Visit  Medication Sig Dispense Refill  . levonorgestrel (MIRENA) 20 MCG/24HR IUD 1 each by Intrauterine route once.    . Multiple Vitamins-Minerals (MULTIVITAMIN WITH MINERALS) tablet Take 1 tablet by mouth daily.     No current facility-administered medications on file prior to visit.    No Known Allergies  Past Medical History  Diagnosis Date  . Heart murmur     No past surgical history on file.  Family History  Problem Relation Age of Onset  . Hypertension Mother   . Hypertension Father   . Diabetes Maternal Aunt   . Hypertension Maternal Aunt   . Cancer Maternal Aunt     History   Social History  . Marital Status: Married    Spouse Name: N/A  . Number of Children: N/A  . Years of Education: N/A   Occupational History  . Not on file.   Social History Main Topics  . Smoking status: Never Smoker   . Smokeless tobacco: Never Used  . Alcohol Use: No  . Drug  Use: No  . Sexual Activity: Yes    Birth Control/ Protection: IUD   Other Topics Concern  . Not on file   Social History Narrative   Married.  3 children, she and her husband both work two jobs.   Good support system.   The PMH, PSH, Social History, Family History, Medications, and allergies have been reviewed in [facility], and have been updated if relevant.   Review of Systems  Constitutional: Negative.   HENT: Negative.   Eyes: Negative.   Respiratory: Negative.   Cardiovascular: Negative.   Gastrointestinal: Negative.   Endocrine: Negative.   Genitourinary: Negative.   Musculoskeletal: Negative.   Skin: Positive for rash.  Allergic/Immunologic: Negative.   Neurological: Negative.   Hematological: Negative.   Psychiatric/Behavioral: Negative.        Objective:    BP 124/62 mmHg  Pulse 71  Temp(Src) 97.9 F (36.6 C) (Oral)  Wt 252 lb 4 oz (114.42 kg)  SpO2 97%  LMP 05/19/2014   Physical Exam  Constitutional: She is oriented to person, place, and time. She appears well-developed and well-nourished. No distress.  HENT:  Head: Normocephalic.  Eyes: Conjunctivae are normal.  Neck: Normal range of motion.  Cardiovascular: Normal rate and regular rhythm.  Pulmonary/Chest: Effort normal and breath sounds normal.  Musculoskeletal: Normal range of motion.  Neurological: She is alert and oriented to person, place, and time. No cranial nerve deficit.  Skin: Rash noted.     Psychiatric: She has a normal mood and affect. Her behavior is normal. Judgment and thought content normal.  Nursing note and vitals reviewed.         Assessment & Plan:   IUD (intrauterine device) in place - Plan: Ambulatory referral to Gynecology  Rash and nonspecific skin eruption  Obesity No Follow-up on file.

## 2014-05-28 NOTE — Progress Notes (Signed)
Pre visit review using our clinic review tool, if applicable. No additional management support is needed unless otherwise documented below in the visit note. 

## 2014-05-28 NOTE — Patient Instructions (Addendum)
Good to see you.  Try lidex twice daily for no more than 1 week without calling me. Please stop using your new fabric softener.  We are restarting phentermine 30 mg daily- come see me in 1 month.

## 2014-05-29 NOTE — Assessment & Plan Note (Signed)
Deteriorated. Motivated to restart diet and exercise.  Did well on phentermine previously and she is aware of risks.  Rx given to pt for phentermine. Follow up in 1 month.

## 2014-05-29 NOTE — Assessment & Plan Note (Signed)
New- most consistent with contact dermatitis from new fabric softener.  Cannot rule out tinea at this point.  Treat with topical lidex.  If no improvement within 1 week, she will call me.  Consider antifungal vs derm referral at that point.  She will also d/c current fabric softener.

## 2014-05-29 NOTE — Assessment & Plan Note (Signed)
Will be due for replacement in 12/2014- referred to GYN.

## 2014-06-26 ENCOUNTER — Ambulatory Visit: Payer: PRIVATE HEALTH INSURANCE | Admitting: Family Medicine

## 2014-07-10 ENCOUNTER — Encounter: Payer: Self-pay | Admitting: Family Medicine

## 2014-07-10 ENCOUNTER — Ambulatory Visit (INDEPENDENT_AMBULATORY_CARE_PROVIDER_SITE_OTHER): Payer: PRIVATE HEALTH INSURANCE | Admitting: Family Medicine

## 2014-07-10 VITALS — BP 124/74 | HR 73 | Temp 97.8°F | Wt 247.0 lb

## 2014-07-10 DIAGNOSIS — R21 Rash and other nonspecific skin eruption: Secondary | ICD-10-CM | POA: Diagnosis not present

## 2014-07-10 DIAGNOSIS — E669 Obesity, unspecified: Secondary | ICD-10-CM

## 2014-07-10 MED ORDER — PHENTERMINE HCL 30 MG PO CAPS
30.0000 mg | ORAL_CAPSULE | ORAL | Status: DC
Start: 2014-07-10 — End: 2014-07-10

## 2014-07-10 MED ORDER — CLOTRIMAZOLE 1 % EX CREA: 1.0000 "application " | TOPICAL_CREAM | Freq: Two times a day (BID) | CUTANEOUS | Status: DC

## 2014-07-10 MED ORDER — PHENTERMINE HCL 30 MG PO CAPS: 30.0000 mg | ORAL_CAPSULE | ORAL | Status: DC

## 2014-07-10 NOTE — Assessment & Plan Note (Signed)
Persistent. D/c lidex - ? Fungal since not responsive to topical steroid. eRx sent for clotrimazole.  Refer to derm for further work up and treatment. The patient indicates understanding of these issues and agrees with the plan.

## 2014-07-10 NOTE — Progress Notes (Signed)
Pre visit review using our clinic review tool, if applicable. No additional management support is needed unless otherwise documented below in the visit note. 

## 2014-07-10 NOTE — Patient Instructions (Signed)
Great to see you Please come see me in 3 months- you look great!  Keep up the good work.  Ok to STOP lidex cream- let's try an antifungal cream.  We will call you with a referral to see a dermatologist.

## 2014-07-10 NOTE — Progress Notes (Addendum)
  Subjective:    Patient ID: Andrea Mcdaniel, female    DOB: 07/16/1980, 34 y.o.   MRN: [id number]  HPI Very pleasant 34 yo female here to follow up obesity.    Obesity- has been an issue for years but she is making positive changes.  Tolerating phentermine 30 mg daily and would like to continue this.  No CP, palpitations, SOB or DOE.    Cut out all fried foods and red meat.   Walking everyday.  Clothes are fitting better which is motivating her to continue working on her lifestyle.  Rash- saw her in 05/2014 for rash that started a couple of week prior- right flank, very itchy. Given rx for lidex to use twice daily- no improvement. No changes in soaps or detergents.   Lab Results  Component Value Date   TSH 1.01 10/15/2012    Wt Readings from Last 3 Encounters:  07/10/14 247 lb (112.038 kg)  05/28/14 252 lb 4 oz (114.42 kg)  01/11/13 249 lb 12 oz (113.286 kg)     Patient Active Problem List   Diagnosis Date Noted  . Obesity 07/10/2014  . Rash and nonspecific skin eruption 05/28/2014  . IUD (intrauterine device) in place 11/18/2011   Past Medical History  Diagnosis Date  . Heart murmur    History reviewed. No pertinent past surgical history. History  Substance Use Topics  . Smoking status: Never Smoker   . Smokeless tobacco: Never Used  . Alcohol Use: No   Family History  Problem Relation Age of Onset  . Hypertension Mother   . Hypertension Father   . Diabetes Maternal Aunt   . Hypertension Maternal Aunt   . Cancer Maternal Aunt    No Known Allergies Current Outpatient Prescriptions on File Prior to Visit  Medication Sig Dispense Refill  . levonorgestrel (MIRENA) 20 MCG/24HR IUD 1 each by Intrauterine route once.    . Multiple Vitamins-Minerals (MULTIVITAMIN WITH MINERALS) tablet Take 1 tablet by mouth daily.     No current facility-administered medications on file prior to visit.   The PMH, PSH, Social History, Family History, Medications, and allergies  [facility], and have been updated if relevant.    Review of Systems Review of Systems  Constitutional: Negative.   Respiratory: Negative.   Cardiovascular: Negative.   Gastrointestinal: Negative.   Genitourinary: Negative.   Skin: Positive for rash.  Neurological: Negative.   Hematological: Negative.   All other systems reviewed and are negative.       Objective:   Physical Exam BP 124/74 mmHg  Pulse 73  Temp(Src) 97.8 F (36.6 C) (Oral)  Wt 247 lb (112.038 kg)  SpO2 97%  LMP 07/04/2014  General:  Obese, Well-developed,well-nourished,in no acute distress; alert,appropriate and cooperative throughout examination Head:  normocephalic and atraumatic.   Eyes:  vision grossly intact, pupils equal, pupils round, and pupils reactive to light.   Ears:  R ear normal and L ear normal.   Nose:  no external deformity.   Mouth:  good dentition.   Lungs:  Normal respiratory effort, chest expands symmetrically. Lungs are clear to auscultation, no crackles or wheezes. Heart:  Normal rate and regular rhythm. S1 and S2 normal without gallop, murmur, click, rub or other extra sounds. Psych:  Cognition and judgment appear intact. Alert and cooperative with normal attention span and concentration. No apparent delusions, illusions, hallucinations Skin:    hyperpigmented confluent rash on right flank- unchanged Assessment & Plan:

## 2014-07-10 NOTE — Assessment & Plan Note (Signed)
Improving. Congratulated her on her hard work and [employer]. Phentermine rx refilled. Follow up in 3 months. The patient indicates understanding of these issues and agrees with the plan.

## 2014-10-14 ENCOUNTER — Ambulatory Visit (INDEPENDENT_AMBULATORY_CARE_PROVIDER_SITE_OTHER): Payer: PRIVATE HEALTH INSURANCE | Admitting: Family Medicine

## 2014-10-14 ENCOUNTER — Encounter: Payer: Self-pay | Admitting: Family Medicine

## 2014-10-14 VITALS — BP 108/68 | HR 86 | Temp 98.0°F | Wt 247.5 lb

## 2014-10-14 DIAGNOSIS — E669 Obesity, unspecified: Secondary | ICD-10-CM

## 2014-10-14 MED ORDER — PHENTERMINE HCL 30 MG PO CAPS: 30.0000 mg | ORAL_CAPSULE | ORAL | Status: DC

## 2014-10-14 NOTE — Progress Notes (Signed)
Pre visit review using our clinic review tool, if applicable. No additional management support is needed unless otherwise documented below in the visit note. 

## 2014-10-14 NOTE — Progress Notes (Signed)
  Subjective:    Patient ID: Andrea Mcdaniel, female    DOB: 05/26/1980, 34 y.o.   MRN: [id number]  HPI Very pleasant 34 yo female here to follow up obesity.    Obesity- has been an issue for years but she is making positive changes.  Tolerating phentermine 30 mg daily and would like to continue this.  No CP, palpitations, SOB or DOE.    Cut out all fried foods and red meat.   Eating only fresh veggies and chicken in the evenings.  Walking everyday.  Clothes are fitting better which is motivating her to continue working on her lifestyle.  Weight stable here but on home scale, she is losing weight.   Wt Readings from Last 3 Encounters:  10/14/14 247 lb 8 oz (112.265 kg)  07/10/14 247 lb (112.038 kg)  05/28/14 252 lb 4 oz (114.42 kg)     Patient Active Problem List   Diagnosis Date Noted  . Obesity 07/10/2014  . IUD (intrauterine device) in place 11/18/2011   Past Medical History  Diagnosis Date  . Heart murmur    History reviewed. No pertinent past surgical history. History  Substance Use Topics  . Smoking status: Never Smoker   . Smokeless tobacco: Never Used  . Alcohol Use: No   Family History  Problem Relation Age of Onset  . Hypertension Mother   . Hypertension Father   . Diabetes Maternal Aunt   . Hypertension Maternal Aunt   . Cancer Maternal Aunt    No Known Allergies Current Outpatient Prescriptions on File Prior to Visit  Medication Sig Dispense Refill  . levonorgestrel (MIRENA) 20 MCG/24HR IUD 1 each by Intrauterine route once.    . Multiple Vitamins-Minerals (MULTIVITAMIN WITH MINERALS) tablet Take 1 tablet by mouth daily.     No current facility-administered medications on file prior to visit.   The PMH, PSH, Social History, Family History, Medications, and allergies [facility], and have been updated if relevant.    Review of Systems Review of Systems  Constitutional: Negative.   Respiratory: Negative.   Cardiovascular: Negative.    Gastrointestinal: Negative.   Genitourinary: Negative.   Skin: Negative.  Negative for rash.  Neurological: Negative.   Hematological: Negative.   All other systems reviewed and are negative.       Objective:   Physical Exam BP 108/68 mmHg  Pulse 106  Temp(Src) 98 F (36.7 C) (Oral)  Wt 247 lb 8 oz (112.265 kg)  SpO2 98%  LMP 09/22/2014  BP Readings from Last 3 Encounters:  10/14/14 108/68  07/10/14 124/74  05/28/14 124/62    General:  Obese, Well-developed,well-nourished,in no acute distress; alert,appropriate and cooperative throughout examination Head:  normocephalic and atraumatic.   Eyes:  vision grossly intact, pupils equal, pupils round, and pupils reactive to light.   Ears:  R ear normal and L ear normal.   Nose:  no external deformity.   Mouth:  good dentition.   Lungs:  Normal respiratory effort, chest expands symmetrically. Lungs are clear to auscultation, no crackles or wheezes. Heart:  Normal rate and regular rhythm. S1 and S2 normal without gallop, murmur, click, rub or other extra sounds. Psych:  Cognition and judgment appear intact. Alert and cooperative with normal attention span and concentration. No apparent delusions, illusions, hallucinations   Assessment & Plan:

## 2014-10-14 NOTE — Assessment & Plan Note (Signed)
Tolerating phentermine well. She would like to continue this. Aware of risks including HTN, pulmonary HTN, stroke.    She would like to start phentermine and lifestyle changes.  Follow up in 2 month.  If BMI < 27 will decrease to half dose x 1 month then stop

## 2014-12-08 ENCOUNTER — Encounter: Payer: Self-pay | Admitting: Family Medicine

## 2014-12-15 ENCOUNTER — Ambulatory Visit: Payer: PRIVATE HEALTH INSURANCE | Admitting: Family Medicine

## 2014-12-18 ENCOUNTER — Encounter: Payer: Self-pay | Admitting: Family Medicine

## 2014-12-18 ENCOUNTER — Ambulatory Visit (INDEPENDENT_AMBULATORY_CARE_PROVIDER_SITE_OTHER): Payer: PRIVATE HEALTH INSURANCE | Admitting: Family Medicine

## 2014-12-18 VITALS — BP 132/78 | HR 87 | Temp 97.5°F | Wt 246.2 lb

## 2014-12-18 DIAGNOSIS — Z23 Encounter for immunization: Secondary | ICD-10-CM

## 2014-12-18 DIAGNOSIS — E669 Obesity, unspecified: Secondary | ICD-10-CM

## 2014-12-18 MED ORDER — PHENTERMINE HCL 37.5 MG PO CAPS: 37.5000 mg | ORAL_CAPSULE | ORAL | Status: DC

## 2014-12-18 MED ORDER — PHENTERMINE HCL 30 MG PO CAPS: 30.0000 mg | ORAL_CAPSULE | ORAL | Status: DC

## 2014-12-18 NOTE — Assessment & Plan Note (Signed)
Discussed weight loss plan.  She is aware of phentermine risk benefits, side effects including HTN, pulmonary HTN, stroke.    She would like to increase dose of phentermine to 37.5 mg daily and will start walking again every day.  Follow up in 2 months.  If BMI < 27 will decrease to half dose x 1 month then stop

## 2014-12-18 NOTE — Progress Notes (Signed)
  Subjective:    Patient ID: Andrea Mcdaniel, female    DOB: May 05, 1980, 34 y.o.   MRN: [id number]  HPI Very pleasant 34 yo female here to follow up obesity.    Obesity- has been an issue for years but she is making positive changes.  Tolerating phentermine 30 mg daily but would like to increase the dose.  Now that school has restarted, she has not been exercising.  No CP, palpitations, SOB or DOE.    Cut out all fried foods and red meat.   Eating only fresh veggies and chicken in the evenings.     Wt Readings from Last 3 Encounters:  12/18/14 246 lb 4 oz (111.698 kg)  10/14/14 247 lb 8 oz (112.265 kg)  07/10/14 247 lb (112.038 kg)     Patient Active Problem List   Diagnosis Date Noted  . Obesity 07/10/2014  . IUD (intrauterine device) in place 11/18/2011   Past Medical History  Diagnosis Date  . Heart murmur    History reviewed. No pertinent past surgical history. Social History  Substance Use Topics  . Smoking status: Never Smoker   . Smokeless tobacco: Never Used  . Alcohol Use: No   Family History  Problem Relation Age of Onset  . Hypertension Mother   . Hypertension Father   . Diabetes Maternal Aunt   . Hypertension Maternal Aunt   . Cancer Maternal Aunt    No Known Allergies Current Outpatient Prescriptions on File Prior to Visit  Medication Sig Dispense Refill  . levonorgestrel (MIRENA) 20 MCG/24HR IUD 1 each by Intrauterine route once.    . Multiple Vitamins-Minerals (MULTIVITAMIN WITH MINERALS) tablet Take 1 tablet by mouth daily.     No current facility-administered medications on file prior to visit.   The PMH, PSH, Social History, Family History, Medications, and allergies have been reviewed in [facility], and have been updated if relevant.    Review of Systems Review of Systems  Constitutional: Negative.   Respiratory: Negative.   Cardiovascular: Negative.   Gastrointestinal: Negative.   Genitourinary: Negative.   Skin: Negative.  Negative for rash.   Neurological: Negative.   Hematological: Negative.   All other systems reviewed and are negative.       Objective:   Physical Exam BP 132/78 mmHg  Pulse 87  Temp(Src) 97.5 F (36.4 C) (Oral)  Wt 246 lb 4 oz (111.698 kg)  SpO2 97%  BP Readings from Last 3 Encounters:  12/18/14 132/78  10/14/14 108/68  07/10/14 124/74    General:  Obese, Well-developed,well-nourished,in no acute distress; alert,appropriate and cooperative throughout examination Head:  normocephalic and atraumatic.   Eyes:  vision grossly intact, pupils equal, pupils round, and pupils reactive to light.   Ears:  R ear normal and L ear normal.   Nose:  no external deformity.   Mouth:  good dentition.   Lungs:  Normal respiratory effort, chest expands symmetrically. Lungs are clear to auscultation, no crackles or wheezes. Heart:  Normal rate and regular rhythm. S1 and S2 normal without gallop, murmur, click, rub or other extra sounds. Psych:  Cognition and judgment appear intact. Alert and cooperative with normal attention span and concentration. No apparent delusions, illusions, hallucinations   Assessment & Plan:

## 2014-12-18 NOTE — Progress Notes (Signed)
Pre visit review using our clinic review tool, if applicable. No additional management support is needed unless otherwise documented below in the visit note. 

## 2014-12-18 NOTE — Patient Instructions (Signed)
Great to see you. Please start exercising again. Come see me again in 2 months.

## 2015-02-17 ENCOUNTER — Ambulatory Visit (INDEPENDENT_AMBULATORY_CARE_PROVIDER_SITE_OTHER): Payer: PRIVATE HEALTH INSURANCE | Admitting: Family Medicine

## 2015-02-17 ENCOUNTER — Encounter: Payer: Self-pay | Admitting: Family Medicine

## 2015-02-17 VITALS — BP 144/66 | HR 84 | Temp 98.0°F | Wt 246.2 lb

## 2015-02-17 DIAGNOSIS — E669 Obesity, unspecified: Secondary | ICD-10-CM

## 2015-02-17 MED ORDER — PHENTERMINE HCL 37.5 MG PO CAPS: 37.5000 mg | ORAL_CAPSULE | ORAL | Status: DC

## 2015-02-17 NOTE — Progress Notes (Signed)
Pre visit review using our clinic review tool, if applicable. No additional management support is needed unless otherwise documented below in the visit note. 

## 2015-02-17 NOTE — Assessment & Plan Note (Signed)
Doing well on current dose phentermine. She is aware of risks. Continue lifestyle changes. Rx printed and given to pt. Follow up in 2 months. The patient indicates understanding of these issues and agrees with the plan.

## 2015-02-17 NOTE — Progress Notes (Signed)
  Subjective:    Patient ID: Andrea Mcdaniel, female    DOB: 09/30/1980, 34 y.o.   MRN: [id number]  HPI Very pleasant 34 yo female here to follow up obesity.    Obesity- has been an issue for years but she is making positive changes.  Tolerating higher dose phentermine 37.5 mg daily and would like to continue this.  No CP, palpitations, SOB or DOE.    Cut out all fried foods and red meat.  Still walking every day.  Weight stable here but according to home scale, has lost 6 pounds. Wt Readings from Last 3 Encounters:  02/17/15 246 lb 4 oz (111.698 kg)  12/18/14 246 lb 4 oz (111.698 kg)  10/14/14 247 lb 8 oz (112.265 kg)     Patient Active Problem List   Diagnosis Date Noted  . Obesity 07/10/2014  . IUD (intrauterine device) in place 11/18/2011   Past Medical History  Diagnosis Date  . Heart murmur    No past surgical history on file. Social History  Substance Use Topics  . Smoking status: Never Smoker   . Smokeless tobacco: Never Used  . Alcohol Use: No   Family History  Problem Relation Age of Onset  . Hypertension Mother   . Hypertension Father   . Diabetes Maternal Aunt   . Hypertension Maternal Aunt   . Cancer Maternal Aunt    No Known Allergies Current Outpatient Prescriptions on File Prior to Visit  Medication Sig Dispense Refill  . levonorgestrel (MIRENA) 20 MCG/24HR IUD 1 each by Intrauterine route once.    . Multiple Vitamins-Minerals (MULTIVITAMIN WITH MINERALS) tablet Take 1 tablet by mouth daily.     No current facility-administered medications on file prior to visit.   The PMH, PSH, Social History, Family History, Medications, and allergies [facility], and have been updated if relevant.    Review of Systems Review of Systems  Constitutional: Negative.   Respiratory: Negative.   Cardiovascular: Negative.   Gastrointestinal: Negative.   Genitourinary: Negative.   Skin: Negative.  Negative for rash.  Neurological: Negative.    Hematological: Negative.   All other systems reviewed and are negative.       Objective:   Physical Exam BP 144/66 mmHg  Pulse 94  Temp(Src) 98 F (36.7 C) (Oral)  Wt 246 lb 4 oz (111.698 kg)  SpO2 97%  LMP 02/02/2015  BP Readings from Last 3 Encounters:  02/17/15 144/66  12/18/14 132/78  10/14/14 108/68    General:  Obese, Well-developed,well-nourished,in no acute distress; alert,appropriate and cooperative throughout examination Head:  normocephalic and atraumatic.   Eyes:  vision grossly intact, pupils equal, pupils round, and pupils reactive to light.   Ears:  R ear normal and L ear normal.   Nose:  no external deformity.   Mouth:  good dentition.   Lungs:  Normal respiratory effort, chest expands symmetrically. Lungs are clear to auscultation, no crackles or wheezes. Heart:  Normal rate and regular rhythm. S1 and S2 normal without gallop, murmur, click, rub or other extra sounds. Psych:  Cognition and judgment appear intact. Alert and cooperative with normal attention span and concentration. No apparent delusions, illusions, hallucinations   Assessment & Plan:

## 2015-02-17 NOTE — Patient Instructions (Signed)
Great to see you. Please come see me in 2 months. 

## 2015-04-20 ENCOUNTER — Ambulatory Visit: Payer: PRIVATE HEALTH INSURANCE | Admitting: Family Medicine

## 2015-04-20 ENCOUNTER — Telehealth: Payer: Self-pay | Admitting: Family Medicine

## 2015-04-20 NOTE — Telephone Encounter (Signed)
Pt did not come in for their appt today for a follow up. Please let me know if pt needs to be contacted immediately for follow up or no follow up needed. Best phone number to contact pt is [phone].

## 2015-04-21 ENCOUNTER — Encounter: Payer: Self-pay | Admitting: Family Medicine

## 2016-02-02 ENCOUNTER — Encounter: Payer: Self-pay | Admitting: Family Medicine

## 2016-02-02 ENCOUNTER — Ambulatory Visit (INDEPENDENT_AMBULATORY_CARE_PROVIDER_SITE_OTHER): Payer: PRIVATE HEALTH INSURANCE | Admitting: Family Medicine

## 2016-02-02 VITALS — BP 130/68 | HR 82 | Temp 97.9°F | Ht 63.5 in | Wt 258.0 lb

## 2016-02-02 DIAGNOSIS — E669 Obesity, unspecified: Secondary | ICD-10-CM

## 2016-02-02 DIAGNOSIS — Z01419 Encounter for gynecological examination (general) (routine) without abnormal findings: Secondary | ICD-10-CM | POA: Diagnosis not present

## 2016-02-02 LAB — CBC WITH DIFFERENTIAL/PLATELET
BASOS ABS: 0 10*3/uL (ref 0.0–0.1)
Basophils Relative: 0.4 % (ref 0.0–3.0)
EOS ABS: 0.1 10*3/uL (ref 0.0–0.7)
Eosinophils Relative: 2.3 % (ref 0.0–5.0)
HCT: 38.9 % (ref 36.0–46.0)
Hemoglobin: 12.9 g/dL (ref 12.0–15.0)
LYMPHS ABS: 2.3 10*3/uL (ref 0.7–4.0)
LYMPHS PCT: 41.9 % (ref 12.0–46.0)
MCHC: 33.2 g/dL (ref 30.0–36.0)
MCV: 84.6 fl (ref 78.0–100.0)
MONO ABS: 0.5 10*3/uL (ref 0.1–1.0)
Monocytes Relative: 8.3 % (ref 3.0–12.0)
NEUTROS ABS: 2.6 10*3/uL (ref 1.4–7.7)
NEUTROS PCT: 47.1 % (ref 43.0–77.0)
PLATELETS: 244 10*3/uL (ref 150.0–400.0)
RBC: 4.6 Mil/uL (ref 3.87–5.11)
RDW: 14.1 % (ref 11.5–15.5)
WBC: 5.6 10*3/uL (ref 4.0–10.5)

## 2016-02-02 LAB — COMPREHENSIVE METABOLIC PANEL
ALT: 13 U/L (ref 0–35)
AST: 16 U/L (ref 0–37)
Albumin: 3.7 g/dL (ref 3.5–5.2)
Alkaline Phosphatase: 52 U/L (ref 39–117)
BILIRUBIN TOTAL: 0.5 mg/dL (ref 0.2–1.2)
BUN: 10 mg/dL (ref 6–23)
CHLORIDE: 106 meq/L (ref 96–112)
CO2: 26 meq/L (ref 19–32)
CREATININE: 0.78 mg/dL (ref 0.40–1.20)
Calcium: 9 mg/dL (ref 8.4–10.5)
GFR: 107.53 mL/min (ref >60.00)
GLUCOSE: 92 mg/dL (ref 70–99)
Potassium: 3.7 mEq/L (ref 3.5–5.1)
SODIUM: 138 meq/L (ref 135–145)
Total Protein: 7.2 g/dL (ref 6.0–8.3)

## 2016-02-02 LAB — LIPID PANEL
CHOL/HDL RATIO: 3
Cholesterol: 122 mg/dL (ref 0–200)
HDL: 46 mg/dL (ref >39.00)
LDL CALC: 64 mg/dL (ref 0–99)
NONHDL: 75.85
Triglycerides: 61 mg/dL (ref 0.0–149.0)
VLDL: 12.2 mg/dL (ref 0.0–40.0)

## 2016-02-02 LAB — TSH: TSH: 1.45 u[IU]/mL (ref 0.35–4.50)

## 2016-02-02 MED ORDER — PHENTERMINE HCL 37.5 MG PO CAPS: 37.5000 mg | ORAL_CAPSULE | ORAL | 2 refills | Status: DC

## 2016-02-02 NOTE — Assessment & Plan Note (Signed)
Reviewed preventive care protocols, scheduled due services, and updated immunizations Discussed nutrition, exercise, diet, and healthy lifestyle.  Orders Placed This Encounter  Procedures  . CBC with Differential/Platelet  . Comprehensive metabolic panel  . Lipid panel  . TSH   Influenza vaccine today.

## 2016-02-02 NOTE — Progress Notes (Signed)
Subjective:   Patient ID: Andrea Mcdaniel, female    DOB: 08/23/1980, 35 y.o.   MRN: [id number]  Andrea Haberoccaro Rehfeldt is a pleasant 35 y.o. year old female who presents to clinic today with Annual Exam  on 02/02/2016  HPI:  Last pap smear was last June- had mirena inserted at that time. No h/o abnormal pap smears.  Obesity- has been an issue for years but she is making positive changes.  Tolerated phentermine 37.5 mg daily and would like to restart this.  Lost 15 pounds with it and did not have  CP, palpitations, SOB or DOE.    Lab Results  Component Value Date   CHOL 113 10/15/2012   HDL 41.80 10/15/2012   LDLCALC 60 10/15/2012   TRIG 57.0 10/15/2012   CHOLHDL 3 10/15/2012   Lab Results  Component Value Date   CREATININE 0.8 10/15/2012   Lab Results  Component Value Date   CHOL 113 10/15/2012   HDL 41.80 10/15/2012   LDLCALC 60 10/15/2012   TRIG 57.0 10/15/2012   CHOLHDL 3 10/15/2012   Lab Results  Component Value Date   TSH 1.01 10/15/2012   Lab Results  Component Value Date   WBC 5.9 10/15/2012   HGB 13.0 10/15/2012   HCT 39.3 10/15/2012   MCV 85.3 10/15/2012   PLT 245.0 10/15/2012   Current Outpatient Prescriptions on File Prior to Visit  Medication Sig Dispense Refill  . levonorgestrel (MIRENA) 20 MCG/24HR IUD 1 each by Intrauterine route once.    . Multiple Vitamins-Minerals (MULTIVITAMIN WITH MINERALS) tablet Take 1 tablet by mouth daily.     No current facility-administered medications on file prior to visit.     No Known Allergies  Past Medical History:  Diagnosis Date  . Heart murmur     No past surgical history on file.  Family History  Problem Relation Age of Onset  . Hypertension Mother   . Hypertension Father   . Diabetes Maternal Aunt   . Hypertension Maternal Aunt   . Cancer Maternal Aunt     Social History   Social History  . Marital status: Married    Spouse name: N/A  . Number of children: N/A  . Years of education: N/A    Occupational History  . Not on file.   Social History Main Topics  . Smoking status: Never Smoker  . Smokeless tobacco: Never Used  . Alcohol use No  . Drug use: No  . Sexual activity: Yes    Birth control/ protection: IUD   Other Topics Concern  . Not on file   Social History Narrative   Married.  3 children, she and her husband both work two jobs.   Good support system.   The PMH, PSH, Social History, Family History, Medications, and allergies have [facility], and have been updated if relevant.   Review of Systems  Constitutional: Negative.   HENT: Negative.   Respiratory: Negative.   Cardiovascular: Negative.   Gastrointestinal: Negative.   Endocrine: Negative.   Genitourinary: Negative.   Musculoskeletal: Negative.   Allergic/Immunologic: Negative.   Neurological: Negative.   Hematological: Negative.   Psychiatric/Behavioral: Negative.   All other systems reviewed and are negative.      Objective:    BP 130/68   Pulse 82   Temp 97.9 F (36.6 C) (Oral)   Ht 5' 3.5" (1.613 m)   Wt 258 lb (117 kg)   LMP 01/05/2016   SpO2 97%   BMI  44.99 kg/m    Wt Readings from Last 3 Encounters:  02/02/16 258 lb (117 kg)  02/17/15 246 lb 4 oz (111.7 kg)  12/18/14 246 lb 4 oz (111.7 kg)    Physical Exam   General:  Well-developed,well-nourished,in no acute distress; alert,appropriate and cooperative throughout examination Head:  normocephalic and atraumatic.   Eyes:  vision grossly intact, pupils equal, pupils round, and pupils reactive to light.   Ears:  R ear normal and L ear normal.   Nose:  no external deformity.   Mouth:  good dentition.   Neck:  No deformities, masses, or tenderness noted. Breasts:  No mass, nodules, thickening, tenderness, bulging, retraction, inflamation, nipple discharge or skin changes noted.   Lungs:  Normal respiratory effort, chest expands symmetrically. Lungs are clear to auscultation, no crackles or wheezes. Heart:   Normal rate and regular rhythm. S1 and S2 normal without gallop, murmur, click, rub or other extra sounds. Abdomen:  Bowel sounds positive,abdomen soft and non-tender without masses, organomegaly or hernias noted. Msk:  No deformity or scoliosis noted of thoracic or lumbar spine.   Extremities:  No clubbing, cyanosis, edema, or deformity noted with normal full range of motion of all joints.   Neurologic:  alert & oriented X3 and gait normal.   Skin:  Intact without suspicious lesions or rashes Cervical Nodes:  No lymphadenopathy noted Axillary Nodes:  No palpable lymphadenopathy Psych:  Cognition and judgment appear intact. Alert and cooperative with normal attention span and concentration. No apparent delusions, illusions, hallucinations       Assessment & Plan:   Well woman exam - Plan: CBC with Differential/Platelet, Comprehensive metabolic panel, Lipid panel, TSH  Obesity without serious comorbidity, unspecified classification, unspecified obesity type No Follow-up on file.

## 2016-02-02 NOTE — Progress Notes (Signed)
Pre visit review using our clinic review tool, if applicable. No additional management support is needed unless otherwise documented below in the visit note. 

## 2016-02-02 NOTE — Assessment & Plan Note (Signed)
Deteriorated. Aware of risks and benefits of phentermine. Rx printed and given to pt. Follow up in 3 months.

## 2016-02-02 NOTE — Patient Instructions (Signed)
Great to see you. We will call you with your results and you can view them online.  Please come see me in 3 months.  Happy Halloween and Happy birthday in [birth year]!

## 2016-02-04 ENCOUNTER — Encounter: Payer: Self-pay | Admitting: *Deleted

## 2016-02-10 ENCOUNTER — Telehealth: Payer: Self-pay | Admitting: *Deleted

## 2016-02-10 NOTE — Telephone Encounter (Signed)
Fax received indicating PA required for pts phentermine Rx. LM on pts vm and advised she will need to bring insurance card to office before PA can be completed. Per chart, pt did not have it available at the time of her previous OV

## 2016-02-10 NOTE — Telephone Encounter (Signed)
Pt contacted office back and states she is aware that insurance does not cover requested medication and she has picked it up and paid for it out of pocket. She also states insurance info has not changed since last yr

## 2016-05-04 ENCOUNTER — Encounter: Payer: Self-pay | Admitting: Family Medicine

## 2016-05-04 ENCOUNTER — Ambulatory Visit (INDEPENDENT_AMBULATORY_CARE_PROVIDER_SITE_OTHER): Payer: PRIVATE HEALTH INSURANCE | Admitting: Family Medicine

## 2016-05-04 VITALS — BP 124/62 | HR 101 | Temp 97.8°F | Wt 245.5 lb

## 2016-05-04 DIAGNOSIS — E669 Obesity, unspecified: Secondary | ICD-10-CM

## 2016-05-04 MED ORDER — PHENTERMINE HCL 37.5 MG PO CAPS: 37.5000 mg | ORAL_CAPSULE | ORAL | 2 refills | Status: DC

## 2016-05-04 NOTE — Assessment & Plan Note (Signed)
Discussed weight loss plan. She would like to continue taking phentermine at current dose.  She is aware of phentermine risk benefits, side effects including HTN, pulmonary HTN, stroke.      Follow up in 3 months.  If BMI < 27 will decrease to half dose x 1 month then stop

## 2016-05-04 NOTE — Progress Notes (Signed)
Subjective:   Patient ID: Andrea Mcdaniel, female    DOB: 03/24/1981, 36 y.o.   MRN: [id number]  Andrea Haberoccaro Glockner is a pleasant 36 y.o. year old female who presents to clinic today with Follow-up (phentermine)  on 05/04/2016  HPI:   Obesity- has been an issue for years but she is making positive changes.  Tolerated phentermine 37.5 mg daily and would like to continue this.  Doing cardio at least 4 days per week and weight training.   CP, palpitations, SOB or DOE.    No issues with insomnia.  Wt Readings from Last 3 Encounters:  05/04/16 245 lb 8 oz (111.4 kg)  02/02/16 258 lb (117 kg)  02/17/15 246 lb 4 oz (111.7 kg)     Current Outpatient Prescriptions on File Prior to Visit  Medication Sig Dispense Refill  . levonorgestrel (MIRENA) 20 MCG/24HR IUD 1 each by Intrauterine route once.    . Multiple Vitamins-Minerals (MULTIVITAMIN WITH MINERALS) tablet Take 1 tablet by mouth daily.     No current facility-administered medications on file prior to visit.     No Known Allergies  Past Medical History:  Diagnosis Date  . Heart murmur     No past surgical history on file.  Family History  Problem Relation Age of Onset  . Hypertension Mother   . Hypertension Father   . Diabetes Maternal Aunt   . Hypertension Maternal Aunt   . Cancer Maternal Aunt     Social History   Social History  . Marital status: Married    Spouse name: N/A  . Number of children: N/A  . Years of education: N/A   Occupational History  . Not on file.   Social History Main Topics  . Smoking status: Never Smoker  . Smokeless tobacco: Never Used  . Alcohol use No  . Drug use: No  . Sexual activity: Yes    Birth control/ protection: IUD   Other Topics Concern  . Not on file   Social History Narrative   Married.  3 children, she and her husband both work two jobs.   Good support system.   The PMH, PSH, Social History, Family History, Medications, and [facility],  and have been updated if relevant.   Review of Systems  Respiratory: Negative.   Cardiovascular: Negative.   Gastrointestinal: Negative.   Musculoskeletal: Negative.   Psychiatric/Behavioral: Negative for sleep disturbance. The patient is not nervous/anxious.   All other systems reviewed and are negative.      Objective:    BP 124/62   Pulse (!) 101   Temp 97.8 F (36.6 C) (Oral)   Wt 245 lb 8 oz (111.4 kg)   SpO2 95%   BMI 42.81 kg/m    Physical Exam  Constitutional: She is oriented to person, place, and time. She appears well-developed and well-nourished. No distress.  HENT:  Head: Normocephalic and atraumatic.  Eyes: Conjunctivae are normal.  Cardiovascular: Normal rate and regular rhythm.   Pulmonary/Chest: Effort normal and breath sounds normal.  Musculoskeletal: Normal range of motion.  Neurological: She is alert and oriented to person, place, and time. No cranial nerve deficit.  Skin: Skin is warm and dry. She is not diaphoretic.  Psychiatric: She has a normal mood and affect. Her behavior is normal. Judgment and thought content normal.  Nursing note and vitals reviewed.         Assessment & Plan:   Obesity without serious comorbidity, unspecified classification, unspecified obesity  type No Follow-up on file.

## 2016-05-04 NOTE — Progress Notes (Signed)
Pre visit review using our clinic review tool, if applicable. No additional management support is needed unless otherwise documented below in the visit note. 

## 2016-06-20 ENCOUNTER — Other Ambulatory Visit: Payer: Self-pay | Admitting: Family Medicine

## 2016-06-20 NOTE — Telephone Encounter (Signed)
Left refill on voice mail at pharmacy  

## 2016-06-20 NOTE — Telephone Encounter (Signed)
Last filled 05-10-16 #30 Last OV 05-04-16 Next OV 08-01-16

## 2016-08-01 ENCOUNTER — Encounter: Payer: Self-pay | Admitting: Family Medicine

## 2016-08-01 ENCOUNTER — Ambulatory Visit (INDEPENDENT_AMBULATORY_CARE_PROVIDER_SITE_OTHER): Payer: PRIVATE HEALTH INSURANCE | Admitting: Family Medicine

## 2016-08-01 VITALS — BP 106/70 | HR 75 | Temp 97.8°F | Wt 250.0 lb

## 2016-08-01 DIAGNOSIS — E669 Obesity, unspecified: Secondary | ICD-10-CM | POA: Diagnosis not present

## 2016-08-01 MED ORDER — PHENTERMINE HCL 37.5 MG PO CAPS: 37.5000 mg | ORAL_CAPSULE | Freq: Every morning | ORAL | 3 refills | Status: DC

## 2016-08-01 NOTE — Assessment & Plan Note (Signed)
Deteriorated but she is exercising again and motivated to lose weight. Asking for refill of phentermine at current dose. Aware of risks. Rx for phentermine 37.5 mg daily printed and given to pt. Follow up in 4 months.

## 2016-08-01 NOTE — Progress Notes (Signed)
Subjective:   Patient ID: Andrea Mcdaniel, female    DOB: 12/26/1980, 36 y.o.   MRN: [id number]  Andrea Mcdaniel is a pleasant 36 y.o. year old female who presents to clinic today with Weight Loss (3 month follow-up)  on 08/01/2016  HPI:  Obesity- has been an issue for years but she is making positive changes. Toleratedphentermine 37.5mg  daily and would like to continue this.  "fell off the wagon.a few months ago but now back on track.  Just started Kick ball and working out 5 days per week.  Motivated to lose weight.   CP, palpitations, SOB or DOE.   No issues with insomnia.  Wt Readings from Last 3 Encounters:  08/01/16 250 lb (113.4 kg)  05/04/16 245 lb 8 oz (111.4 kg)  02/02/16 258 lb (117 kg)     Current Outpatient Prescriptions on File Prior to Visit  Medication Sig Dispense Refill  . levonorgestrel (MIRENA) 20 MCG/24HR IUD 1 each by Intrauterine route once.    . Multiple Vitamins-Minerals (MULTIVITAMIN WITH MINERALS) tablet Take 1 tablet by mouth daily.    . phentermine 37.5 MG capsule TAKE ONE CAPSULE BY MOUTH EVERY MORNING 30 capsule 0   No current facility-administered medications on file prior to visit.     No Known Allergies  Past Medical History:  Diagnosis Date  . Heart murmur     No past surgical history on file.  Family History  Problem Relation Age of Onset  . Hypertension Mother   . Hypertension Father   . Diabetes Maternal Aunt   . Hypertension Maternal Aunt   . Cancer Maternal Aunt     Social History   Social History  . Marital status: Married    Spouse name: N/A  . Number of children: N/A  . Years of education: N/A   Occupational History  . Not on file.   Social History Main Topics  . Smoking status: Never Smoker  . Smokeless tobacco: Never Used  . Alcohol use No  . Drug use: No  . Sexual activity: Yes    Birth control/ protection: IUD   Other Topics Concern  . Not on file   Social History Narrative   Married.  3  children, she and her husband both work two jobs.   Good support system.   The PMH, PSH, Social History, Family History, Medications, and allergies have been reviewed in [facility], and have been updated if relevant.   Review of Systems  Constitutional: Negative.   Respiratory: Negative.   Cardiovascular: Negative.   Gastrointestinal: Negative.   Psychiatric/Behavioral: Negative.   All other systems reviewed and are negative.      Objective:    BP 106/70 (BP Location: Left Arm, Patient Position: Sitting, Cuff Size: Large)   Pulse 75   Temp 97.8 F (36.6 C) (Oral)   Wt 250 lb (113.4 kg)   SpO2 98%   BMI 43.59 kg/m    Physical Exam    General:  Well-developed,well-nourished,in no acute distress; alert,appropriate and cooperative throughout examination Head:  normocephalic and atraumatic.   Lungs:  Normal respiratory effort, chest expands symmetrically. Lungs are clear to auscultation, no crackles or wheezes. Heart:  Normal rate and regular rhythm. S1 and S2 normal without gallop, murmur, click, rub or other extra sounds. Msk:  No deformity or scoliosis noted of thoracic or lumbar spine.   Extremities:  No clubbing, cyanosis, edema, or deformity noted with normal full range of motion of all joints.   Neurologic:  alert & oriented X3 and gait normal.   Skin:  Intact without suspicious lesions or rashes Psych:  Cognition and judgment appear intact. Alert and cooperative with normal attention span and concentration. No apparent delusions, illusions, hallucinations      Assessment & Plan:   Class 1 obesity without serious comorbidity in adult, unspecified BMI, unspecified obesity type No Follow-up on file.

## 2016-08-01 NOTE — Patient Instructions (Signed)
Great to see you.  Keep up the great work.  Come see me in 4 months.

## 2016-08-01 NOTE — Progress Notes (Signed)
Pre visit review using our clinic review tool, if applicable. No additional management support is needed unless otherwise documented below in the visit note. 

## 2016-09-21 ENCOUNTER — Other Ambulatory Visit: Payer: Self-pay | Admitting: Family Medicine

## 2016-12-01 ENCOUNTER — Ambulatory Visit: Payer: PRIVATE HEALTH INSURANCE | Admitting: Family Medicine

## 2019-06-08 ENCOUNTER — Ambulatory Visit: Payer: PRIVATE HEALTH INSURANCE

## 2019-07-04 ENCOUNTER — Ambulatory Visit (INDEPENDENT_AMBULATORY_CARE_PROVIDER_SITE_OTHER): Payer: PRIVATE HEALTH INSURANCE | Admitting: Obstetrics & Gynecology

## 2019-07-04 ENCOUNTER — Other Ambulatory Visit: Payer: Self-pay

## 2019-07-04 ENCOUNTER — Encounter: Payer: Self-pay | Admitting: Obstetrics & Gynecology

## 2019-07-04 ENCOUNTER — Other Ambulatory Visit (HOSPITAL_COMMUNITY)
Admission: RE | Admit: 2019-07-04 | Discharge: 2019-07-04 | Disposition: A | Discharge status: Home / Self Care | Payer: PRIVATE HEALTH INSURANCE | Source: Ambulatory Visit | Attending: Obstetrics & Gynecology | Admitting: Obstetrics & Gynecology

## 2019-07-04 VITALS — BP 136/82 | HR 74 | Wt 261.1 lb

## 2019-07-04 DIAGNOSIS — Z01419 Encounter for gynecological examination (general) (routine) without abnormal findings: Secondary | ICD-10-CM | POA: Insufficient documentation

## 2019-07-04 DIAGNOSIS — Z1151 Encounter for screening for human papillomavirus (HPV): Secondary | ICD-10-CM | POA: Diagnosis not present

## 2019-07-04 DIAGNOSIS — Z30433 Encounter for removal and reinsertion of intrauterine contraceptive device: Secondary | ICD-10-CM

## 2019-07-04 MED ORDER — LEVONORGESTREL 20 MCG/24HR IU IUD: INTRAUTERINE_SYSTEM | Freq: Once | INTRAUTERINE | Status: AC

## 2019-07-04 NOTE — Patient Instructions (Signed)
IUD PLACEMENT POST-PROCEDURE INSTRUCTIONS  1. You may take Ibuprofen, Aleve or Tylenol for pain if needed.  Cramping should resolve within in 24 hours.  2. You may have a small amount of spotting.  You should wear a mini pad for the next few days.  3. You may have intercourse after 24 hours.  If you using this for birth control, it is effective immediately in most cases but can use back up contraception for one week.  4. You need to call if you have any pelvic pain, fever, heavy bleeding or foul smelling vaginal discharge.  Irregular bleeding is common the first several months after having an IUD placed. You do not need to call for this reason unless you are concerned.  5. Shower or bathe as normal  6. You should have a follow-up appointment in 4-8 weeks for a re-check to make sure you are not having any problems.      Preventive Care 40-103 Years Old, Female Preventive care refers to visits with your health care provider and lifestyle choices that can promote health and wellness. This includes:  A yearly physical exam. This may also be called an annual well check.  Regular dental visits and eye exams.  Immunizations.  Screening for certain conditions.  Healthy lifestyle choices, such as eating a healthy diet, getting regular exercise, not using drugs or products that contain nicotine and tobacco, and limiting alcohol use. What can I expect for my preventive care visit? Physical exam Your health care provider will check your:  Height and weight. This may be used to calculate body mass index (BMI), which tells if you are at a healthy weight.  Heart rate and blood pressure.  Skin for abnormal spots. Counseling Your health care provider may ask you questions about your:  Alcohol, tobacco, and drug use.  Emotional well-being.  Home and relationship well-being.  Sexual activity.  Eating habits.  Work and work Statistician.  Method of birth control.  Menstrual  cycle.  Pregnancy history. What immunizations do I need?  Influenza (flu) vaccine  This is recommended every year. Tetanus, diphtheria, and pertussis (Tdap) vaccine  You may need a Td booster every 10 years. Varicella (chickenpox) vaccine  You may need this if you have not been vaccinated. Human papillomavirus (HPV) vaccine  If recommended by your health care provider, you may need three doses over 6 months. Measles, mumps, and rubella (MMR) vaccine  You may need at least one dose of MMR. You may also need a second dose. Meningococcal conjugate (MenACWY) vaccine  One dose is recommended if you are age 4-21 years and a first-year college student living in a residence hall, or if you have one of several medical conditions. You may also need additional booster doses. Pneumococcal conjugate (PCV13) vaccine  You may need this if you have certain conditions and were not previously vaccinated. Pneumococcal polysaccharide (PPSV23) vaccine  You may need one or two doses if you smoke cigarettes or if you have certain conditions. Hepatitis A vaccine  You may need this if you have certain conditions or if you travel or work in places where you may be exposed to hepatitis A. Hepatitis B vaccine  You may need this if you have certain conditions or if you travel or work in places where you may be exposed to hepatitis B. Haemophilus influenzae type b (Hib) vaccine  You may need this if you have certain conditions. You may receive vaccines as individual doses or as more than one  vaccine together in one shot (combination vaccines). Talk with your health care provider about the risks and benefits of combination vaccines. What tests do I need?  Blood tests  Lipid and cholesterol levels. These may be checked every 5 years starting at age 40.  Hepatitis C test.  Hepatitis B test. Screening  Diabetes screening. This is done by checking your blood sugar (glucose) after you have not eaten  for a while (fasting).  Sexually transmitted disease (STD) testing.  BRCA-related cancer screening. This may be done if you have a family history of breast, ovarian, tubal, or peritoneal cancers.  Pelvic exam and Pap test. This may be done every 3 years starting at age 40. Starting at age 40, this may be done every 5 years if you have a Pap test in combination with an HPV test. Talk with your health care provider about your test results, treatment options, and if necessary, the need for more tests. Follow these instructions at home: Eating and drinking   Eat a diet that includes fresh fruits and vegetables, whole grains, lean protein, and low-fat dairy.  Take vitamin and mineral supplements as recommended by your health care provider.  Do not drink alcohol if: ? Your health care provider tells you not to drink. ? You are pregnant, may be pregnant, or are planning to become pregnant.  If you drink alcohol: ? Limit how much you have to 0-1 drink a day. ? Be aware of how much alcohol is in your drink. In the [place] one drink equals one 12 oz bottle of beer (355 mL), one 5 oz glass of wine (148 mL), or one 1 oz glass of hard liquor (44 mL). Lifestyle  Take daily care of your teeth and gums.  Stay active. Exercise for at least 30 minutes on 5 or more days each week.  Do not use any products that contain nicotine or tobacco, such as cigarettes, e-cigarettes, and chewing tobacco. If you need help quitting, ask your health care provider.  If you are sexually active, practice safe sex. Use a condom or other form of birth control (contraception) in order to prevent pregnancy and STIs (sexually transmitted infections). If you plan to become pregnant, see your health care provider for a preconception visit. What's next?  Visit your health care provider once a year for a well check visit.  Ask your health care provider how often you should have your eyes and teeth checked.  Stay up to date  on all vaccines. This information is not intended to replace advice given to you by your health care provider. Make sure you discuss any questions you have with your health care provider. Document Revised: 11/30/2017 Document Reviewed: 11/30/2017 Elsevier Patient Education  2020 [employer].

## 2019-07-04 NOTE — Progress Notes (Signed)
GYNECOLOGY ANNUAL PREVENTATIVE CARE ENCOUNTER NOTE  History:     Andrea Mcdaniel is a 39 y.o. [phone] female here for a routine annual gynecologic exam.  Current complaints: none.  Desires replacement of Mirena, had this placed in 2014.   Denies abnormal vaginal bleeding, discharge, pelvic pain, problems with intercourse or other gynecologic concerns.    Gynecologic History No LMP recorded. Contraception: IUD Last Pap: 04/28/2010. Results were: normal  Obstetric History OB History  Gravida Para Term Preterm AB Living  3 3 2 1  0 3  SAB TAB Ectopic Multiple Live Births  0 0 0 0      # Outcome Date GA Lbr Len/2nd Weight Sex Delivery Anes PTL Lv  3 Term 09/21/09 [redacted]w[redacted]d  7 lb 10 oz (3.459 kg) M Vag-Spont EPI    2 Term 03/26/08 [redacted]w[redacted]d  7 lb 8 oz (3.402 kg) M Vag-Spont     1 Preterm 09/23/06 [redacted]w[redacted]d  3 lb 6.3 oz (1.539 kg) F Vag-Spont       Past Medical History:  Diagnosis Date  . Heart murmur     History reviewed. No pertinent surgical history.  Current Outpatient Medications on File Prior to Visit  Medication Sig Dispense Refill  . Multiple Vitamins-Minerals (MULTIVITAMIN WITH MINERALS) tablet Take 1 tablet by mouth daily.    Marland Kitchen loratadine (CLARITIN) 10 MG tablet Take 10 mg by mouth daily.    . phentermine 37.5 MG capsule Take 1 capsule (37.5 mg total) by mouth every morning. (Patient not taking: Reported on 07/04/2019) 30 capsule 3   No current facility-administered medications on file prior to visit.    No Known Allergies  Social History:  reports that she has never smoked. She has never used smokeless tobacco. She reports that she does not drink alcohol or use drugs.  Family History  Problem Relation Age of Onset  . Hypertension Mother   . Hypertension Father   . Diabetes Maternal Aunt   . Hypertension Maternal Aunt   . Cancer Maternal Aunt     The following portions of the patient's history were reviewed and updated as appropriate: allergies, current medications, past  family history, past medical history, past social history, past surgical history and problem list.  Review of Systems Pertinent items noted in HPI and remainder of comprehensive ROS otherwise negative.  Physical Exam:  BP 136/82   Pulse 74   Wt 261 lb 1.6 oz (118.4 kg)   BMI 45.53 kg/m  CONSTITUTIONAL: Well-developed, well-nourished female in no acute distress.  HENT:  Normocephalic, atraumatic, External right and left ear normal. Oropharynx is clear and moist EYES: Conjunctivae and EOM are normal. Pupils are equal, round, and reactive to light. No scleral icterus.  NECK: Normal range of motion, supple, no masses.  Normal thyroid.  SKIN: Skin is warm and dry. No rash noted. Not diaphoretic. No erythema. No pallor. MUSCULOSKELETAL: Normal range of motion. No tenderness.  No cyanosis, clubbing, or edema.  2+ distal pulses. NEUROLOGIC: Alert and oriented to person, place, and time. Normal reflexes, muscle tone coordination.  PSYCHIATRIC: Normal mood and affect. Normal behavior. Normal judgment and thought content. CARDIOVASCULAR: Normal heart rate noted, regular rhythm RESPIRATORY: Clear to auscultation bilaterally. Effort and breath sounds normal, no problems with respiration noted. BREASTS: Symmetric in size. No masses, tenderness, skin changes, nipple drainage, or lymphadenopathy bilaterally. Performed in the presence of a chaperone. ABDOMEN: Soft, no distention noted.  No tenderness, rebound or guarding.  PELVIC: Normal appearing external genitalia and urethral meatus;  normal appearing vaginal mucosa and cervix.  No abnormal discharge noted. No strings seen. Pap smear obtained.  Normal uterine size, no other palpable masses, no uterine or adnexal tenderness.  Performed in the presence of a chaperone.   IUD Removal and Reinsertion  Patient identified, informed consent performed, consent signed.   Discussed risks of irregular bleeding, cramping, infection, malpositioning or misplacement of  the IUD outside the uterus which may require further procedures. Also discussed >99% contraception efficacy, increased risk of ectopic pregnancy with failure of method.  Advised to use backup contraception for one week as the risk of pregnancy is higher during the transition period of removing an IUD and replacing it with another one. Time out was performed. Speculum placed in the vagina. The strings of the IUD were not seen.   The cervix was cleaned with Betadine x 2 and grasped anteriorly with a single tooth tenaculum.Kelly forceps were introduced into the lower uterine cavity, and the strings were grasped. The old Mirena IUD was successfully removed in its entirety.  The cervix was re-swabbed with Betadine x 2. The new Mirena IUD insertion apparatus was used to sound the uterus to 9 cm;  the IUD was then placed per manufacturer's recommendations. Strings trimmed to 3 cm. Tenaculum was removed, good hemostasis noted. Patient tolerated procedure well.    Assessment and Plan:    1. Encounter for IUD removal and reinsertion - levonorgestrel (MIRENA) 20 MCG/24HR IUD replaced today. Patient was given post-procedure instructions.  She was reminded to have backup contraception for one week during this transition period between IUDs.  Patient was also asked to check IUD strings periodically and follow up in 4 weeks for IUD check.  2. Well woman exam with routine gynecological exam - Cytology - PAP - Cervicovaginal ancillary only( [place]) Will follow up results of pap smear and labs and manage accordingly. Mammogram to be scheduled next year as she turns 39 in 03/2020 Routine preventative health maintenance measures emphasized. Please refer to After Visit Summary for other counseling recommendations.      Jaynie Collins, MD, FACOG Obstetrician & Gynecologist, [facility] for [employer], [facility]

## 2019-07-05 LAB — CERVICOVAGINAL ANCILLARY ONLY
Bacterial Vaginitis (gardnerella): NEGATIVE
Candida Glabrata: NEGATIVE
Candida Vaginitis: NEGATIVE
Chlamydia: NEGATIVE
Comment: NEGATIVE
Comment: NEGATIVE
Comment: NEGATIVE
Comment: NEGATIVE
Comment: NEGATIVE
Comment: NORMAL
Neisseria Gonorrhea: NEGATIVE
Trichomonas: NEGATIVE

## 2019-07-08 LAB — CYTOLOGY - PAP
Comment: NEGATIVE
Diagnosis: NEGATIVE
High risk HPV: NEGATIVE

## 2019-07-11 ENCOUNTER — Encounter: Payer: Self-pay | Admitting: Radiology

## 2019-07-16 ENCOUNTER — Other Ambulatory Visit: Payer: Self-pay

## 2019-07-16 ENCOUNTER — Ambulatory Visit (INDEPENDENT_AMBULATORY_CARE_PROVIDER_SITE_OTHER): Payer: PRIVATE HEALTH INSURANCE | Admitting: Internal Medicine

## 2019-07-16 ENCOUNTER — Encounter: Payer: Self-pay | Admitting: Internal Medicine

## 2019-07-16 VITALS — BP 124/80 | HR 79 | Temp 97.9°F | Ht 64.2 in | Wt 259.8 lb

## 2019-07-16 DIAGNOSIS — J302 Other seasonal allergic rhinitis: Secondary | ICD-10-CM

## 2019-07-16 DIAGNOSIS — E559 Vitamin D deficiency, unspecified: Secondary | ICD-10-CM

## 2019-07-16 DIAGNOSIS — R635 Abnormal weight gain: Secondary | ICD-10-CM | POA: Diagnosis not present

## 2019-07-16 DIAGNOSIS — Z6841 Body Mass Index (BMI) 40.0 and over, adult: Secondary | ICD-10-CM

## 2019-07-16 MED ORDER — QSYMIA 3.75-23 MG PO CP24: 3.7500 mg | ORAL_CAPSULE | Freq: Every day | ORAL | 0 refills | Status: DC

## 2019-07-16 NOTE — Patient Instructions (Signed)

## 2019-07-17 LAB — CMP14+EGFR
ALT: 11 IU/L (ref 0–32)
AST: 15 IU/L (ref 0–40)
Albumin/Globulin Ratio: 1.4 (ref 1.2–2.2)
Albumin: 4.2 g/dL (ref 3.8–4.8)
Alkaline Phosphatase: 74 IU/L (ref 39–117)
BUN/Creatinine Ratio: 15 (ref 9–23)
BUN: 12 mg/dL (ref 6–20)
Bilirubin Total: 0.2 mg/dL (ref 0.0–1.2)
CO2: 23 mmol/L (ref 20–29)
Calcium: 9.3 mg/dL (ref 8.7–10.2)
Chloride: 104 mmol/L (ref 96–106)
Creatinine, Ser: 0.79 mg/dL (ref 0.57–1.00)
GFR calc Af Amer: 109 mL/min/{1.73_m2} (ref >59)
GFR calc non Af Amer: 95 mL/min/{1.73_m2} (ref >59)
Globulin, Total: 3.1 g/dL (ref 1.5–4.5)
Glucose: 83 mg/dL (ref 65–99)
Potassium: 4.4 mmol/L (ref 3.5–5.2)
Sodium: 139 mmol/L (ref 134–144)
Total Protein: 7.3 g/dL (ref 6.0–8.5)

## 2019-07-17 LAB — CBC
Hematocrit: 41.2 % (ref 34.0–46.6)
Hemoglobin: 13.7 g/dL (ref 11.1–15.9)
MCH: 28.8 pg (ref 26.6–33.0)
MCHC: 33.3 g/dL (ref 31.5–35.7)
MCV: 87 fL (ref 79–97)
Platelets: 286 10*3/uL (ref 150–450)
RBC: 4.76 x10E6/uL (ref 3.77–5.28)
RDW: 12.9 % (ref 11.7–15.4)
WBC: 6.7 10*3/uL (ref 3.4–10.8)

## 2019-07-17 LAB — TSH: TSH: 0.815 u[IU]/mL (ref 0.450–4.500)

## 2019-07-17 LAB — INSULIN, RANDOM: INSULIN: 14.5 u[IU]/mL (ref 2.6–24.9)

## 2019-07-17 LAB — VITAMIN D 25 HYDROXY (VIT D DEFICIENCY, FRACTURES): Vit D, 25-Hydroxy: 28.8 ng/mL — ABNORMAL LOW (ref 30.0–100.0)

## 2019-07-28 NOTE — Progress Notes (Signed)
This visit occurred during the SARS-CoV-2 public health emergency.  Safety protocols were in place, including screening questions prior to the visit, additional usage of staff PPE, and extensive cleaning of exam room while observing appropriate contact time as indicated for disinfecting solutions.  Subjective:     Patient ID: Andrea Mcdaniel , female    DOB: Mar 04, 1981 , 39 y.o.   MRN: [id number]   Chief Complaint  Patient presents with  . Establish Care  . Weight Loss    patient was on phentermine but she is no longer taking it     HPI  She presents today to establish primary care. She reports she was referred by D.Lewis, who is also patient of [facility]. She denies h/o heart disease, high blood pressure and diabetes. She is followed by DR. Anywu for her GYN care.  Menarche at age 18, last cycle was last week of March.   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   She is most concerned about weight gain that occurred since the pandemic. She thinks she has gained 25-30 pounds. She admits she has not been exercising on a regular basis.     Past Medical History:  Diagnosis Date  . Heart murmur   . Seasonal allergies      Family History  Problem Relation Age of Onset  . Hypertension Mother   . Hypertension Father   . Diabetes Father   . Diabetes Maternal Aunt   . Hypertension Maternal Aunt   . Cancer Maternal Aunt        breast cancer  . Hypertension Maternal Grandmother   . Diabetes Maternal Grandmother   . Cancer Maternal Grandfather        prostate  . Cancer Paternal Grandmother        stomach     Current Outpatient Medications:  .  loratadine (CLARITIN) 10 MG tablet, Take 10 mg by mouth daily., Disp: , Rfl:  .  Multiple Vitamins-Minerals (MULTIVITAMIN WITH MINERALS) tablet, Take 1 tablet by mouth daily., Disp: , Rfl:  .  Phentermine-Topiramate (QSYMIA) 3.75-23 MG CP24, Take 3.75 mg by mouth daily., Disp: 14 capsule, Rfl:  0   No Known Allergies    The patient states she uses none for birth control. Last LMP was No LMP recorded.. Negative for Dysmenorrhea . Negative for: breast discharge, breast lump(s), breast pain and breast self exam. Associated symptoms include abnormal vaginal bleeding. Pertinent negatives include abnormal bleeding (hematology), anxiety, decreased libido, depression, difficulty falling sleep, dyspareunia, history of infertility, nocturia, sexual dysfunction, sleep disturbances, urinary incontinence, urinary urgency, vaginal discharge and vaginal itching. Diet regular.The patient states her exercise level is  intermittent.  . The patient's tobacco use is:  Social History   Tobacco Use  Smoking Status Never Smoker  Smokeless Tobacco Never Used  . She has been exposed to passive smoke. The patient's alcohol use is:  Social History   Substance and Sexual Activity  Alcohol Use No    Review of Systems  Constitutional: Negative.   HENT: Negative.   Eyes: Negative.   Respiratory: Negative.   Cardiovascular: Negative.   Gastrointestinal: Negative.   Genitourinary: Negative.   Neurological: Negative.   Psychiatric/Behavioral: Negative.      Today's Vitals   07/16/19 1435  BP: 124/80  Pulse: 79  Temp: 97.9 F (36.6 C)  TempSrc: Oral  Weight: 259 lb 12.8 oz (117.8 kg)  Height: 5' 4.2" (1.631 m)  PainSc: 0-No pain  Body mass index is 44.32 kg/m.   Objective:  Physical Exam Vitals and nursing note reviewed.  Constitutional:      Appearance: Normal appearance. She is obese.  HENT:     Head: Normocephalic and atraumatic.  Cardiovascular:     Rate and Rhythm: Normal rate and regular rhythm.     Heart sounds: Normal heart sounds.  Pulmonary:     Effort: Pulmonary effort is normal.     Breath sounds: Normal breath sounds.  Skin:    General: Skin is warm.  Neurological:     General: No focal deficit present.     Mental Status: She is alert.  Psychiatric:        Mood  and Affect: Mood normal.        Behavior: Behavior normal.         Assessment And Plan:     1. Weight gain finding  I will check labs as listed below. Importance of regular exercise was discussed with the patient. She is encouraged to aim for at least 30 minutes five days per week. She agrees to consider pharmacologic therapy for obesity. I will defer treatment until I have reviewed her lab results.   - TSH - CMP14+EGFR - Insulin, random(561) - CBC no Diff  2. Seasonal allergies  Chronic, she will continue with loratadine '10mg'$  daily. She is advised to add Flonase NS one spray each nostril should her sx worsen.   3. Class 3 severe obesity due to excess calories with serious comorbidity and body mass index (BMI) of 40.0 to 44.9 in adult [facility])  She is encouraged to strive for BMI less than 35 to decrease cardiac risk. Again, she is willing to try pharmacologic therapy.   4. Vitamin D deficiency disease  I WILL CHECK A VIT D LEVEL AND SUPPLEMENT AS NEEDED.  ALSO ENCOURAGED TO SPEND 15 MINUTES IN THE SUN DAILY.  - Vitamin D (25 hydroxy)   Maximino Greenland, MD    THE PATIENT IS ENCOURAGED TO PRACTICE SOCIAL DISTANCING DUE TO THE COVID-19 PANDEMIC.

## 2019-07-30 ENCOUNTER — Other Ambulatory Visit: Payer: Self-pay

## 2019-07-30 ENCOUNTER — Other Ambulatory Visit: Payer: Self-pay | Admitting: Internal Medicine

## 2019-07-30 MED ORDER — VITAMIN D (ERGOCALCIFEROL) 1.25 MG (50000 UNIT) PO CAPS: ORAL_CAPSULE | ORAL | 2 refills | Status: DC

## 2019-08-08 ENCOUNTER — Other Ambulatory Visit: Payer: Self-pay

## 2019-08-08 ENCOUNTER — Ambulatory Visit (INDEPENDENT_AMBULATORY_CARE_PROVIDER_SITE_OTHER): Payer: PRIVATE HEALTH INSURANCE | Admitting: Obstetrics & Gynecology

## 2019-08-08 ENCOUNTER — Encounter: Payer: Self-pay | Admitting: Obstetrics & Gynecology

## 2019-08-08 VITALS — BP 136/81 | HR 81

## 2019-08-08 DIAGNOSIS — Z30431 Encounter for routine checking of intrauterine contraceptive device: Secondary | ICD-10-CM

## 2019-08-08 NOTE — Progress Notes (Signed)
    GYNECOLOGY OFFICE ENCOUNTER NOTE  History:  39 y.o. [id number] here today for today for IUD string check; Mirena  IUD was placed 07/04/2019. No complaints about the IUD, no concerning side effects.  The following portions of the patient's history were reviewed and updated as appropriate: allergies, current medications, past family history, past medical history, past social history, past surgical history and problem list. Last pap smear on 07/04/2019 was normal, negative HRHPV.  Review of Systems:  Pertinent items are noted in HPI.   Objective:  Physical Exam Blood pressure 136/81, pulse 81. CONSTITUTIONAL: Well-developed, well-nourished female in no acute distress.  HENT:  Normocephalic, atraumatic. External right and left ear normal. Oropharynx is clear and moist EYES: Conjunctivae and EOM are normal. Pupils are equal, round, and reactive to light. No scleral icterus.  NECK: Normal range of motion, supple, no masses CARDIOVASCULAR: Normal heart rate noted RESPIRATORY: Effort and breath sounds normal, no problems with respiration noted ABDOMEN: Soft, no distention noted.   PELVIC: Normal appearing external genitalia; normal appearing vaginal mucosa and cervix.  IUD strings visualized, about 2 cm in length outside cervix.   Assessment & Plan:  Patient to keep IUD in place for up to seven years; can come in for removal if she desires pregnancy earlier or for any concerning side effects. All questions answered.   Total face-to-face time with patient: 10 minutes. Over 50% of encounter was spent on counseling and coordination of care.   Jaynie Collins, MD, FACOG Obstetrician & Gynecologist, [facility] for [employer], [facility]

## 2019-08-23 ENCOUNTER — Other Ambulatory Visit: Payer: Self-pay | Admitting: Internal Medicine

## 2019-08-26 ENCOUNTER — Other Ambulatory Visit: Payer: Self-pay | Admitting: Internal Medicine

## 2019-08-26 ENCOUNTER — Encounter: Payer: Self-pay | Admitting: Internal Medicine

## 2019-08-26 MED ORDER — QSYMIA 7.5-46 MG PO CP24: 7.5000 mg | ORAL_CAPSULE | Freq: Every morning | ORAL | 1 refills | Status: DC

## 2019-08-26 NOTE — Telephone Encounter (Signed)
New patient 07/16/19 Next 01/27/20

## 2019-09-20 ENCOUNTER — Other Ambulatory Visit: Payer: Self-pay | Admitting: Internal Medicine

## 2019-09-24 ENCOUNTER — Encounter: Payer: Self-pay | Admitting: Internal Medicine

## 2019-09-24 ENCOUNTER — Other Ambulatory Visit: Payer: Self-pay

## 2019-09-24 ENCOUNTER — Ambulatory Visit (INDEPENDENT_AMBULATORY_CARE_PROVIDER_SITE_OTHER): Payer: PRIVATE HEALTH INSURANCE | Admitting: Internal Medicine

## 2019-09-24 VITALS — BP 110/74 | HR 82 | Temp 98.0°F | Ht 64.2 in | Wt 255.4 lb

## 2019-09-24 DIAGNOSIS — Z23 Encounter for immunization: Secondary | ICD-10-CM

## 2019-09-24 DIAGNOSIS — Z6841 Body Mass Index (BMI) 40.0 and over, adult: Secondary | ICD-10-CM

## 2019-09-24 MED ORDER — QSYMIA 7.5-46 MG PO CP24: 7.5000 mg | ORAL_CAPSULE | Freq: Every morning | ORAL | 0 refills | Status: DC

## 2019-09-24 MED ORDER — TETANUS-DIPHTH-ACELL PERTUSSIS 5-2.5-18.5 LF-MCG/0.5 IM SUSP
0.5000 mL | Freq: Once | INTRAMUSCULAR | Status: AC
  Administered 2019-09-24: 0.5 mL via INTRAMUSCULAR

## 2019-09-24 NOTE — Progress Notes (Signed)
This visit occurred during the SARS-CoV-2 public health emergency.  Safety protocols were in place, including screening questions prior to the visit, additional usage of staff PPE, and extensive cleaning of exam room while observing appropriate contact time as indicated for disinfecting solutions.  Subjective:     Patient ID: Andrea Mcdaniel , female    DOB: 10/01/1980 , 39 y.o.   MRN: [id number]   Chief Complaint  Patient presents with  . Obesity    HPI  She is here today for f/u obesity. She has been taking Qsymia 3.75mg  without any issues. She did not experience any side effects. She would like to start higher dose.     Past Medical History:  Diagnosis Date  . Heart murmur   . Seasonal allergies      Family History  Problem Relation Age of Onset  . Hypertension Mother   . Hypertension Father   . Diabetes Father   . Diabetes Maternal Aunt   . Hypertension Maternal Aunt   . Cancer Maternal Aunt        breast cancer  . Hypertension Maternal Grandmother   . Diabetes Maternal Grandmother   . Cancer Maternal Grandfather        prostate  . Cancer Paternal Grandmother        stomach     Current Outpatient Medications:  .  ELDERBERRY PO, Take by mouth. 2 gummies per day, Disp: , Rfl:  .  loratadine (CLARITIN) 10 MG tablet, Take 10 mg by mouth daily., Disp: , Rfl:  .  Multiple Vitamins-Minerals (MULTIVITAMIN WITH MINERALS) tablet, Take 1 tablet by mouth daily., Disp: , Rfl:  .  Phentermine-Topiramate (QSYMIA) 7.5-46 MG CP24, Take 7.5 mg by mouth every morning., Disp: 30 capsule, Rfl: 1 .  Vitamin D, Ergocalciferol, (DRISDOL) 1.25 MG (50000 UNIT) CAPS capsule, TAKE 1 CAPSULE BY MOUTH ON TUESDAY AND FRIDAYS, Disp: 25 capsule, Rfl: 1   No Known Allergies   Review of Systems  Constitutional: Negative.   Respiratory: Negative.   Cardiovascular: Negative.   Gastrointestinal: Negative.   Neurological: Negative.   Psychiatric/Behavioral: Negative.      Today's Vitals    09/24/19 1623  BP: 110/74  Pulse: 82  Temp: 98 F (36.7 C)  TempSrc: Oral  Weight: 255 lb 6.4 oz (115.8 kg)  Height: 5' 4.2" (1.631 m)   Body mass index is 43.57 kg/m.   Wt Readings from Last 3 Encounters:  09/24/19 255 lb 6.4 oz (115.8 kg)  07/16/19 259 lb 12.8 oz (117.8 kg)  07/04/19 261 lb 1.6 oz (118.4 kg)     Objective:  Physical Exam Vitals and nursing note reviewed.  Constitutional:      Appearance: Normal appearance. She is obese.  HENT:     Head: Normocephalic and atraumatic.  Cardiovascular:     Rate and Rhythm: Normal rate and regular rhythm.     Heart sounds: Normal heart sounds.  Pulmonary:     Effort: Pulmonary effort is normal.     Breath sounds: Normal breath sounds.  Skin:    General: Skin is warm.     Coloration: Jaundice:   Neurological:     General: No focal deficit present.     Mental Status: She is alert.  Psychiatric:        Mood and Affect: Mood normal.        Behavior: Behavior normal.         Assessment And Plan:     1. Class 3 severe obesity  due to excess calories without serious comorbidity with body mass index (BMI) of 40.0 to 44.9 in adult [facility])  Chronic. She was congratulated on her six pound weight loss and encouraged to keep up the great work. A new rx for qsymia 7.5mg /46mg  once daily was sent to the pharmacy. If too expensive, she agrees to use mail order pharmacy.   2. Immunization due  - Tdap (BOOSTRIX) injection 0.5 mL   Gwynneth Aliment, MD    THE PATIENT IS ENCOURAGED TO PRACTICE SOCIAL DISTANCING DUE TO THE COVID-19 PANDEMIC.

## 2019-09-27 ENCOUNTER — Other Ambulatory Visit: Payer: Self-pay | Admitting: Internal Medicine

## 2019-09-27 ENCOUNTER — Encounter: Payer: Self-pay | Admitting: Internal Medicine

## 2019-09-27 MED ORDER — QSYMIA 7.5-46 MG PO CP24: 7.5000 mg | ORAL_CAPSULE | Freq: Every morning | ORAL | 0 refills | Status: DC

## 2019-10-02 ENCOUNTER — Encounter: Payer: Self-pay | Admitting: Internal Medicine

## 2019-10-14 ENCOUNTER — Encounter: Payer: Self-pay | Admitting: Internal Medicine

## 2019-11-13 ENCOUNTER — Encounter: Payer: Self-pay | Admitting: Internal Medicine

## 2019-11-25 ENCOUNTER — Telehealth: Payer: Self-pay

## 2019-11-25 ENCOUNTER — Ambulatory Visit: Payer: PRIVATE HEALTH INSURANCE | Admitting: Internal Medicine

## 2019-11-25 NOTE — Telephone Encounter (Signed)
The pt was called and her appointment for today was rescheduled.

## 2019-11-28 ENCOUNTER — Encounter: Payer: Self-pay | Admitting: Internal Medicine

## 2019-12-23 ENCOUNTER — Other Ambulatory Visit: Payer: Self-pay

## 2019-12-23 ENCOUNTER — Encounter: Payer: Self-pay | Admitting: Internal Medicine

## 2019-12-23 ENCOUNTER — Ambulatory Visit (INDEPENDENT_AMBULATORY_CARE_PROVIDER_SITE_OTHER): Payer: PRIVATE HEALTH INSURANCE | Admitting: Internal Medicine

## 2019-12-23 VITALS — BP 122/72 | HR 86 | Temp 98.0°F | Ht 64.0 in | Wt 247.2 lb

## 2019-12-23 DIAGNOSIS — Z6841 Body Mass Index (BMI) 40.0 and over, adult: Secondary | ICD-10-CM | POA: Diagnosis not present

## 2019-12-23 DIAGNOSIS — Z23 Encounter for immunization: Secondary | ICD-10-CM

## 2019-12-23 NOTE — Progress Notes (Signed)
I,Tianna Badgett,acting as a Neurosurgeon for Gwynneth Aliment, MD.,have documented all relevant documentation on the behalf of Gwynneth Aliment, MD,as directed by  Gwynneth Aliment, MD while in the presence of Gwynneth Aliment, MD.  This visit occurred during the SARS-CoV-2 public health emergency.  Safety protocols were in place, including screening questions prior to the visit, additional usage of staff PPE, and extensive cleaning of exam room while observing appropriate contact time as indicated for disinfecting solutions.  Subjective:     Patient ID: Andrea Mcdaniel , female    DOB: Mar 11, 1981 , 39 y.o.   MRN: [id number]   Chief Complaint  Patient presents with  . Weight Check    HPI  She is here today for f/u obesity. She has been taking Qsymia 7mg  without any issues. She did not experience any side effects. She would like to start higher dose.     Past Medical History:  Diagnosis Date  . Heart murmur   . Seasonal allergies      Family History  Problem Relation Age of Onset  . Hypertension Mother   . Hypertension Father   . Diabetes Father   . Diabetes Maternal Aunt   . Hypertension Maternal Aunt   . Cancer Maternal Aunt        breast cancer  . Hypertension Maternal Grandmother   . Diabetes Maternal Grandmother   . Cancer Maternal Grandfather        prostate  . Cancer Paternal Grandmother        stomach     Current Outpatient Medications:  .  ELDERBERRY PO, Take by mouth. 2 gummies per day, Disp: , Rfl:  .  loratadine (CLARITIN) 10 MG tablet, Take 10 mg by mouth daily., Disp: , Rfl:  .  Multiple Vitamins-Minerals (MULTIVITAMIN WITH MINERALS) tablet, Take 1 tablet by mouth daily., Disp: , Rfl:  .  Phentermine-Topiramate (QSYMIA) 7.5-46 MG CP24, Take 7.5 mg by mouth every morning., Disp: 30 capsule, Rfl: 0 .  Vitamin D, Ergocalciferol, (DRISDOL) 1.25 MG (50000 UNIT) CAPS capsule, TAKE 1 CAPSULE BY MOUTH ON TUESDAY AND FRIDAYS, Disp: 25 capsule, Rfl: 1   No Known Allergies    Review of Systems  Constitutional: Negative.   Respiratory: Negative.   Cardiovascular: Negative.   Gastrointestinal: Negative.   Neurological: Negative.      Today's Vitals   12/23/19 1214  BP: 122/72  Pulse: 86  Temp: 98 F (36.7 C)  TempSrc: Oral  Weight: 247 lb 3.2 oz (112.1 kg)  Height: 5\' 4"  (1.626 m)  PainSc: 0-No pain   Body mass index is 42.43 kg/m.   Wt Readings from Last 3 Encounters:  12/23/19 247 lb 3.2 oz (112.1 kg)  09/24/19 255 lb 6.4 oz (115.8 kg)  07/16/19 259 lb 12.8 oz (117.8 kg)    Objective:  Physical Exam Vitals and nursing note reviewed.  Constitutional:      Appearance: Normal appearance. She is obese.  HENT:     Head: Normocephalic and atraumatic.  Cardiovascular:     Rate and Rhythm: Normal rate and regular rhythm.     Heart sounds: Normal heart sounds.  Pulmonary:     Effort: Pulmonary effort is normal.     Breath sounds: Normal breath sounds.  Skin:    General: Skin is warm.  Neurological:     General: No focal deficit present.     Mental Status: She is alert.  Psychiatric:        Mood and Affect:  Mood normal.        Behavior: Behavior normal.   c      Assessment And Plan:     1. Class 3 severe obesity due to excess calories without serious comorbidity with body mass index (BMI) of 40.0 to 44.9 in adult [facility]) Comments: She was congratulated on her 8 pound weight loss and encouraged to keep up the great work. I will send rx Qsymia 11.25mg  once daily. She will f/u 8 wks. She is reminded to stay well hydrated while on medication. Advised to aim for at least 150 minutes of exercise per week.   2. Need for influenza vaccination - Flu Vaccine QUAD 6+ mos PF IM (Fluarix Quad PF)     Patient was given opportunity to ask questions. Patient verbalized understanding of the plan and was able to repeat key elements of the plan. All questions were answered to their satisfaction.  Gwynneth Aliment, MD   I, Gwynneth Aliment, MD, have  reviewed all documentation for this visit. The documentation on 01/12/20 for the exam, diagnosis, procedures, and orders are all accurate and complete.  THE PATIENT IS ENCOURAGED TO PRACTICE SOCIAL DISTANCING DUE TO THE COVID-19 PANDEMIC.

## 2019-12-23 NOTE — Patient Instructions (Signed)
Qsymia 11.25mg  dose   Preventing Unhealthy Weight Gain, Adult Staying at a healthy weight is important to your overall health. When fat builds up in your body, you may become overweight or obese. Being overweight or obese increases your risk of developing certain health problems, such as heart disease, diabetes, sleeping problems, joint problems, and some types of cancer. Unhealthy weight gain is often the result of making unhealthy food choices or not getting enough exercise. You can make changes to your lifestyle to prevent obesity and stay as healthy as possible. What nutrition changes can be made?   Eat only as much as your body needs. To do this: ? Pay attention to signs that you are hungry or full. Stop eating as soon as you feel full. ? If you feel hungry, try drinking water first before eating. Drink enough water so your urine is clear or pale yellow. ? Eat smaller portions. Pay attention to portion sizes when eating out. ? Look at serving sizes on food labels. Most foods contain more than one serving per container. ? Eat the recommended number of calories for your gender and activity level. For most active people, a daily total of 2,000 calories is appropriate. If you are trying to lose weight or are not very active, you may need to eat fewer calories. Talk with your health care provider or a diet and nutrition specialist (dietitian) about how many calories you need each day.  Choose healthy foods, such as: ? Fruits and vegetables. At each meal, try to fill at least half of your plate with fruits and vegetables. ? Whole grains, such as whole-wheat bread, brown rice, and quinoa. ? Lean meats, such as chicken or fish. ? Other healthy proteins, such as beans, eggs, or tofu. ? Healthy fats, such as nuts, seeds, fatty fish, and olive oil. ? Low-fat or fat-free dairy products.  Check food labels, and avoid food and drinks that: ? Are high in calories. ? Have added sugar. ? Are high in  sodium. ? Have saturated fats or trans fats.  Cook foods in healthier ways, such as by baking, broiling, or grilling.  Make a meal plan for the week, and shop with a grocery list to help you stay on track with your purchases. Try to avoid going to the grocery store when you are hungry.  When grocery shopping, try to shop around the outside of the store first, where the fresh foods are. Doing this helps you to avoid prepackaged foods, which can be high in sugar, salt (sodium), and fat. What lifestyle changes can be made?   Exercise for 30 or more minutes on 5 or more days each week. Exercising may include brisk walking, yard work, biking, running, swimming, and team sports like basketball and soccer. Ask your health care provider which exercises are safe for you.  Do muscle-strengthening activities, such as lifting weights or using resistance bands, on 2 or more days a week.  Do not use any products that contain nicotine or tobacco, such as cigarettes and e-cigarettes. If you need help quitting, ask your health care provider.  Limit alcohol intake to no more than 1 drink a day for nonpregnant women and 2 drinks a day for men. One drink equals 12 oz of beer, 5 oz of wine, or 1 oz of hard liquor.  Try to get 7-9 hours of sleep each night. What other changes can be made?  Keep a food and activity journal to keep track of: ? What you  ate and how many calories you had. Remember to count the calories in sauces, dressings, and side dishes. ? Whether you were active, and what exercises you did. ? Your calorie, weight, and activity goals.  Check your weight regularly. Track any changes. If you notice you have gained weight, make changes to your diet or activity routine.  Avoid taking weight-loss medicines or supplements. Talk to your health care provider before starting any new medicine or supplement.  Talk to your health care provider before trying any new diet or exercise plan. Why are these  changes important? Eating healthy, staying active, and having healthy habits can help you to prevent obesity. Those changes also:  Help you manage stress and emotions.  Help you connect with friends and family.  Improve your self-esteem.  Improve your sleep.  Prevent long-term health problems. What can happen if changes are not made? Being obese or overweight can cause you to develop joint or bone problems, which can make it hard for you to stay active or do activities you enjoy. Being obese or overweight also puts stress on your heart and lungs and can lead to health problems like diabetes, heart disease, and some cancers. Where to find more information Talk with your health care provider or a dietitian about healthy eating and healthy lifestyle choices. You may also find information from:  U.[place]. Department of Agriculture, MyPlate: https://ball-collins.biz/  American Heart Association: www.heart.org  Centers for Disease Control and Prevention: FootballExhibition.com.br Summary  Staying at a healthy weight is important to your overall health. It helps you to prevent certain diseases and health problems, such as heart disease, diabetes, joint problems, sleep disorders, and some types of cancer.  Being obese or overweight can cause you to develop joint or bone problems, which can make it hard for you to stay active or do activities you enjoy.  You can prevent unhealthy weight gain by eating a healthy diet, exercising regularly, not smoking, limiting alcohol, and getting enough sleep.  Talk with your health care provider or a dietitian for guidance about healthy eating and healthy lifestyle choices. This information is not intended to replace advice given to you by your health care provider. Make sure you discuss any questions you have with your health care provider. Document Revised: 03/24/2017 Document Reviewed: 04/27/2016 Elsevier Patient Education  2020 [employer].

## 2019-12-26 ENCOUNTER — Ambulatory Visit: Payer: PRIVATE HEALTH INSURANCE | Admitting: Internal Medicine

## 2020-01-14 ENCOUNTER — Telehealth: Payer: Self-pay

## 2020-01-14 NOTE — Telephone Encounter (Signed)
-----   Message from Dorothyann Peng, MD sent at 01/12/2020 12:01 AM EDT ----- Check on pt please - how is she feeling on 11.25mg  dose of Qsymia? Is she gettting in the mail?

## 2020-01-14 NOTE — Telephone Encounter (Signed)
Check on pt please - how is she feeling on 11.25mg  dose of Qsymia? Is she gettting in the mail?   THE PT SAID THAT SHE IS STILL TAKING THE 7.5 MG BECAUSE SHE THAT IS WHAT WAS SENT.  THE PT SAID THAT THE QSYMIA PHARMACY SAID THAT THE 11.25 MG DOSE WOULD BE SENT WHEN SHE NEEDED A REFILL

## 2020-01-27 ENCOUNTER — Ambulatory Visit (INDEPENDENT_AMBULATORY_CARE_PROVIDER_SITE_OTHER): Payer: PRIVATE HEALTH INSURANCE | Admitting: Internal Medicine

## 2020-01-27 ENCOUNTER — Other Ambulatory Visit: Payer: Self-pay

## 2020-01-27 ENCOUNTER — Encounter: Payer: Self-pay | Admitting: Internal Medicine

## 2020-01-27 VITALS — BP 130/72 | HR 80 | Temp 98.7°F | Ht 64.0 in | Wt 249.4 lb

## 2020-01-27 DIAGNOSIS — Z79899 Other long term (current) drug therapy: Secondary | ICD-10-CM | POA: Diagnosis not present

## 2020-01-27 DIAGNOSIS — E559 Vitamin D deficiency, unspecified: Secondary | ICD-10-CM | POA: Diagnosis not present

## 2020-01-27 DIAGNOSIS — Z6841 Body Mass Index (BMI) 40.0 and over, adult: Secondary | ICD-10-CM

## 2020-01-27 DIAGNOSIS — Z1231 Encounter for screening mammogram for malignant neoplasm of breast: Secondary | ICD-10-CM | POA: Diagnosis not present

## 2020-01-27 DIAGNOSIS — Z0000 Encounter for general adult medical examination without abnormal findings: Secondary | ICD-10-CM

## 2020-01-27 LAB — POCT URINE PREGNANCY: Preg Test, Ur: NEGATIVE

## 2020-01-27 NOTE — Patient Instructions (Signed)
Health Maintenance, Female Adopting a healthy lifestyle and getting preventive care are important in promoting health and wellness. Ask your health care provider about:  The right schedule for you to have regular tests and exams.  Things you can do on your own to prevent diseases and keep yourself healthy. What should I know about diet, weight, and exercise? Eat a healthy diet   Eat a diet that includes plenty of vegetables, fruits, low-fat dairy products, and lean protein.  Do not eat a lot of foods that are high in solid fats, added sugars, or sodium. Maintain a healthy weight Body mass index (BMI) is used to identify weight problems. It estimates body fat based on height and weight. Your health care provider can help determine your BMI and help you achieve or maintain a healthy weight. Get regular exercise Get regular exercise. This is one of the most important things you can do for your health. Most adults should:  Exercise for at least 150 minutes each week. The exercise should increase your heart rate and make you sweat (moderate-intensity exercise).  Do strengthening exercises at least twice a week. This is in addition to the moderate-intensity exercise.  Spend less time sitting. Even light physical activity can be beneficial. Watch cholesterol and blood lipids Have your blood tested for lipids and cholesterol at 40 years of age, then have this test every 5 years. Have your cholesterol levels checked more often if:  Your lipid or cholesterol levels are high.  You are older than 40 years of age.  You are at high risk for heart disease. What should I know about cancer screening? Depending on your health history and family history, you may need to have cancer screening at various ages. This may include screening for:  Breast cancer.  Cervical cancer.  Colorectal cancer.  Skin cancer.  Lung cancer. What should I know about heart disease, diabetes, and high blood  pressure? Blood pressure and heart disease  High blood pressure causes heart disease and increases the risk of stroke. This is more likely to develop in people who have high blood pressure readings, are of African descent, or are overweight.  Have your blood pressure checked: ? Every 3-5 years if you are 18-39 years of age. ? Every year if you are 40 years old or older. Diabetes Have regular diabetes screenings. This checks your fasting blood sugar level. Have the screening done:  Once every three years after age 40 if you are at a normal weight and have a low risk for diabetes.  More often and at a younger age if you are overweight or have a high risk for diabetes. What should I know about preventing infection? Hepatitis B If you have a higher risk for hepatitis B, you should be screened for this virus. Talk with your health care provider to find out if you are at risk for hepatitis B infection. Hepatitis C Testing is recommended for:  Everyone born from 1945 through 1965.  Anyone with known risk factors for hepatitis C. Sexually transmitted infections (STIs)  Get screened for STIs, including gonorrhea and chlamydia, if: ? You are sexually active and are younger than 40 years of age. ? You are older than 40 years of age and your health care provider tells you that you are at risk for this type of infection. ? Your sexual activity has changed since you were last screened, and you are at increased risk for chlamydia or gonorrhea. Ask your health care provider if   you are at risk.  Ask your health care provider about whether you are at high risk for HIV. Your health care provider may recommend a prescription medicine to help prevent HIV infection. If you choose to take medicine to prevent HIV, you should first get tested for HIV. You should then be tested every 3 months for as long as you are taking the medicine. Pregnancy  If you are about to stop having your period (premenopausal) and  you may become pregnant, seek counseling before you get pregnant.  Take 400 to 800 micrograms (mcg) of folic acid every day if you become pregnant.  Ask for birth control (contraception) if you want to prevent pregnancy. Osteoporosis and menopause Osteoporosis is a disease in which the bones lose minerals and strength with aging. This can result in bone fractures. If you are 65 years old or older, or if you are at risk for osteoporosis and fractures, ask your health care provider if you should:  Be screened for bone loss.  Take a calcium or vitamin D supplement to lower your risk of fractures.  Be given hormone replacement therapy (HRT) to treat symptoms of menopause. Follow these instructions at home: Lifestyle  Do not use any products that contain nicotine or tobacco, such as cigarettes, e-cigarettes, and chewing tobacco. If you need help quitting, ask your health care provider.  Do not use street drugs.  Do not share needles.  Ask your health care provider for help if you need support or information about quitting drugs. Alcohol use  Do not drink alcohol if: ? Your health care provider tells you not to drink. ? You are pregnant, may be pregnant, or are planning to become pregnant.  If you drink alcohol: ? Limit how much you use to 0-1 drink a day. ? Limit intake if you are breastfeeding.  Be aware of how much alcohol is in your drink. In the [place] one drink equals one 12 oz bottle of beer (355 mL), one 5 oz glass of wine (148 mL), or one 1 oz glass of hard liquor (44 mL). General instructions  Schedule regular health, dental, and eye exams.  Stay current with your vaccines.  Tell your health care provider if: ? You often feel depressed. ? You have ever been abused or do not feel safe at home. Summary  Adopting a healthy lifestyle and getting preventive care are important in promoting health and wellness.  Follow your health care provider's instructions about healthy  diet, exercising, and getting tested or screened for diseases.  Follow your health care provider's instructions on monitoring your cholesterol and blood pressure. This information is not intended to replace advice given to you by your health care provider. Make sure you discuss any questions you have with your health care provider. Document Revised: 03/14/2018 Document Reviewed: 03/14/2018 Elsevier Patient Education  2020 Elsevier Inc.  

## 2020-01-27 NOTE — Progress Notes (Signed)
I,Tianna Badgett,acting as a Education administrator for Maximino Greenland, MD.,have documented all relevant documentation on the behalf of Maximino Greenland, MD,as directed by  Maximino Greenland, MD while in the presence of Maximino Greenland, MD.  This visit occurred during the SARS-CoV-2 public health emergency.  Safety protocols were in place, including screening questions prior to the visit, additional usage of staff PPE, and extensive cleaning of exam room while observing appropriate contact time as indicated for disinfecting solutions.  Subjective:     Patient ID: Andrea Mcdaniel , female    DOB: [birth year] , 39 y.o.   MRN: [id number]   Chief Complaint  Patient presents with  . Annual Exam    HPI  Patient is here for full physical. She is seen at [facility] for her GYN care. She was seen by them for her physical in May She has no specific concerns at this time.     Past Medical History:  Diagnosis Date  . Heart murmur   . Seasonal allergies      Family History  Problem Relation Age of Onset  . Hypertension Mother   . Hypertension Father   . Diabetes Father   . Diabetes Maternal Aunt   . Hypertension Maternal Aunt   . Cancer Maternal Aunt        breast cancer  . Hypertension Maternal Grandmother   . Diabetes Maternal Grandmother   . Cancer Maternal Grandfather        prostate  . Cancer Paternal Grandmother        stomach     Current Outpatient Medications:  .  ELDERBERRY PO, Take by mouth. 2 gummies per day, Disp: , Rfl:  .  loratadine (CLARITIN) 10 MG tablet, Take 10 mg by mouth daily., Disp: , Rfl:  .  Multiple Vitamins-Minerals (MULTIVITAMIN WITH MINERALS) tablet, Take 1 tablet by mouth daily., Disp: , Rfl:  .  Vitamin D, Ergocalciferol, (DRISDOL) 1.25 MG (50000 UNIT) CAPS capsule, TAKE 1 CAPSULE BY MOUTH ON TUESDAY AND FRIDAYS, Disp: 25 capsule, Rfl: 1   No Known Allergies    The patient states she uses none for birth control. Last LMP was Patient's last menstrual period was  01/19/2020.. Negative for Dysmenorrhea. Negative for: breast discharge, breast lump(s), breast pain and breast self exam. Associated symptoms include abnormal vaginal bleeding. Pertinent negatives include abnormal bleeding (hematology), anxiety, decreased libido, depression, difficulty falling sleep, dyspareunia, history of infertility, nocturia, sexual dysfunction, sleep disturbances, urinary incontinence, urinary urgency, vaginal discharge and vaginal itching. Diet regular.The patient states her exercise level is  moderate.  . The patient's tobacco use is:  Social History   Tobacco Use  Smoking Status Never Smoker  Smokeless Tobacco Never Used  . She has been exposed to passive smoke. The patient's alcohol use is:  Social History   Substance and Sexual Activity  Alcohol Use No    Review of Systems  Constitutional: Negative.   HENT: Negative.   Eyes: Negative.   Respiratory: Negative.   Cardiovascular: Negative.   Gastrointestinal: Negative.   Endocrine: Negative.   Genitourinary: Negative.   Musculoskeletal: Negative.   Skin: Negative.   Allergic/Immunologic: Negative.   Neurological: Negative.   Hematological: Negative.   Psychiatric/Behavioral: Negative.      Today's Vitals   01/27/20 1426  BP: 130/72  Pulse: 80  Temp: 98.7 F (37.1 C)  TempSrc: Oral  Weight: 249 lb 6.4 oz (113.1 kg)  Height: _0  (1.626 m)   Body mass index is  42.81 kg/m.  Wt Readings from Last 3 Encounters:  01/27/20 249 lb 6.4 oz (113.1 kg)  12/23/19 247 lb 3.2 oz (112.1 kg)  09/24/19 255 lb 6.4 oz (115.8 kg)     Objective:  Physical Exam Vitals and nursing note reviewed.  Constitutional:      Appearance: Normal appearance.  HENT:     Head: Normocephalic and atraumatic.     Right Ear: Tympanic membrane, ear canal and external ear normal.     Left Ear: Tympanic membrane, ear canal and external ear normal.     Nose:     Comments: Deferred, masked    Mouth/Throat:     Comments:  Deferred, masked Eyes:     Extraocular Movements: Extraocular movements intact.     Conjunctiva/sclera: Conjunctivae normal.     Pupils: Pupils are equal, round, and reactive to light.  Cardiovascular:     Rate and Rhythm: Normal rate and regular rhythm.     Pulses:          Dorsalis pedis pulses are 3+ on the right side and 3+ on the left side.     Heart sounds: Normal heart sounds.  Pulmonary:     Effort: Pulmonary effort is normal.     Breath sounds: Normal breath sounds.  Chest:     Breasts: Tanner Score is 5.        Right: Normal.        Left: Normal.  Abdominal:     General: Bowel sounds are normal.     Palpations: Abdomen is soft.     Comments: Rounded, soft  Genitourinary:    Comments: deferred Musculoskeletal:        General: Normal range of motion.     Cervical back: Normal range of motion and neck supple.  Feet:     Right foot:     Skin integrity: Skin integrity normal.     Left foot:     Skin integrity: Skin integrity normal.  Skin:    General: Skin is warm and dry.  Neurological:     General: No focal deficit present.     Mental Status: She is alert and oriented to person, place, and time.  Psychiatric:        Mood and Affect: Mood normal.        Behavior: Behavior normal.        Thought Content: Thought content normal.        Judgment: Judgment normal.         Assessment And Plan:     1. Encounter for annual physical exam Comments: A full exam was performed. Importance of monthly self breast exams was discussed with the patient. I will schedule her for baseline mammogram. PATIENT IS ADVISED TO GET 30-45 MINUTES REGULAR EXERCISE NO LESS THAN FOUR TO FIVE DAYS PER WEEK - BOTH WEIGHTBEARING EXERCISES AND AEROBIC ARE RECOMMENDED.  PATIENT IS ADVISED TO FOLLOW A HEALTHY DIET WITH AT LEAST SIX FRUITS/VEGGIES PER DAY, DECREASE INTAKE OF RED MEAT, AND TO INCREASE FISH INTAKE TO TWO DAYS PER WEEK.  MEATS/FISH SHOULD NOT BE FRIED, BAKED OR BROILED IS PREFERABLE.  I  SUGGEST WEARING SPF 50 SUNSCREEN ON EXPOSED PARTS AND ESPECIALLY WHEN IN THE DIRECT SUNLIGHT FOR AN EXTENDED PERIOD OF TIME.  PLEASE AVOID FAST FOOD RESTAURANTS AND INCREASE YOUR WATER INTAKE.  - CBC - Hemoglobin A1c - CMP14+EGFR - Lipid panel - Hepatitis C antibody  2. Vitamin D deficiency disease Comments: I will check vitamin D level  and supplement as needed.   3. Class 3 severe obesity due to excess calories without serious comorbidity with body mass index (BMI) of 40.0 to 44.9 in adult [facility]) Comments: BMI 42. She will continue with Qsymia; however, I will increase the dose to 11.83m once daily. A refill will be sent to mail order pharmacy. She is reminded to stay well hydrated while on this medication. She will rto in 6-8 weeks for re-evaluation.   4. Screening mammogram for breast cancer Comments: [place] [facility] [place] [facility] [place] - [facility] Digital Scre[facility]; Future      Patient was given opportunity to ask questions. Patient verbalized understanding of the plan and was able to repeat key elements of the plan. All questions were answered to their satisfaction.   RMaximino Greenland MD   I, RMaximino Greenland MD, have reviewed all documentation for this visit. The documentation on 01/27/20 for the exam, diagnosis, procedures, and orders are all accurate and complete.  THE PATIENT IS ENCOURAGED TO PRACTICE SOCIAL DISTANCING DUE TO THE COVID-19 PANDEMIC.

## 2020-01-27 NOTE — Addendum Note (Signed)
Addended by: Mariam Dollar on: 01/27/2020 06:25 PM   Modules accepted: Orders

## 2020-01-28 LAB — CMP14+EGFR
ALT: 12 IU/L (ref 0–32)
AST: 16 IU/L (ref 0–40)
Albumin/Globulin Ratio: 1.4 (ref 1.2–2.2)
Albumin: 4.2 g/dL (ref 3.8–4.8)
Alkaline Phosphatase: 77 IU/L (ref 44–121)
BUN/Creatinine Ratio: 12 (ref 9–23)
BUN: 10 mg/dL (ref 6–20)
Bilirubin Total: 0.3 mg/dL (ref 0.0–1.2)
CO2: 20 mmol/L (ref 20–29)
Calcium: 8.7 mg/dL (ref 8.7–10.2)
Chloride: 103 mmol/L (ref 96–106)
Creatinine, Ser: 0.83 mg/dL (ref 0.57–1.00)
GFR calc Af Amer: 103 mL/min/{1.73_m2} (ref >59)
GFR calc non Af Amer: 89 mL/min/{1.73_m2} (ref >59)
Globulin, Total: 3.1 g/dL (ref 1.5–4.5)
Glucose: 84 mg/dL (ref 65–99)
Potassium: 3.6 mmol/L (ref 3.5–5.2)
Sodium: 135 mmol/L (ref 134–144)
Total Protein: 7.3 g/dL (ref 6.0–8.5)

## 2020-01-28 LAB — CBC
Hematocrit: 40.6 % (ref 34.0–46.6)
Hemoglobin: 13.7 g/dL (ref 11.1–15.9)
MCH: 29.2 pg (ref 26.6–33.0)
MCHC: 33.7 g/dL (ref 31.5–35.7)
MCV: 87 fL (ref 79–97)
Platelets: 270 10*3/uL (ref 150–450)
RBC: 4.69 x10E6/uL (ref 3.77–5.28)
RDW: 12.9 % (ref 11.7–15.4)
WBC: 6.6 10*3/uL (ref 3.4–10.8)

## 2020-01-28 LAB — LIPID PANEL
Chol/HDL Ratio: 2.7 ratio (ref 0.0–4.4)
Cholesterol, Total: 137 mg/dL (ref 100–199)
HDL: 50 mg/dL (ref >39)
LDL Chol Calc (NIH): 74 mg/dL (ref 0–99)
Triglycerides: 60 mg/dL (ref 0–149)
VLDL Cholesterol Cal: 13 mg/dL (ref 5–40)

## 2020-01-28 LAB — HEPATITIS C ANTIBODY: Hep C Virus Ab: <0.1 s/co ratio (ref 0.0–0.9)

## 2020-01-28 LAB — HEMOGLOBIN A1C
Est. average glucose Bld gHb Est-mCnc: 111 mg/dL
Hgb A1c MFr Bld: 5.5 % (ref 4.8–5.6)

## 2020-03-24 ENCOUNTER — Ambulatory Visit: Payer: PRIVATE HEALTH INSURANCE | Admitting: Internal Medicine

## 2020-03-30 ENCOUNTER — Other Ambulatory Visit: Payer: Self-pay | Admitting: Internal Medicine

## 2020-04-08 ENCOUNTER — Ambulatory Visit: Payer: PRIVATE HEALTH INSURANCE | Admitting: Internal Medicine

## 2020-05-12 ENCOUNTER — Ambulatory Visit: Payer: PRIVATE HEALTH INSURANCE | Admitting: Internal Medicine

## 2020-05-28 ENCOUNTER — Encounter: Payer: Self-pay | Admitting: Radiology

## 2020-07-31 ENCOUNTER — Ambulatory Visit (INDEPENDENT_AMBULATORY_CARE_PROVIDER_SITE_OTHER): Payer: PRIVATE HEALTH INSURANCE | Admitting: Nurse Practitioner

## 2020-07-31 ENCOUNTER — Other Ambulatory Visit: Payer: Self-pay

## 2020-07-31 VITALS — BP 120/76 | HR 72 | Temp 98.2°F | Ht 63.4 in | Wt 247.6 lb

## 2020-07-31 DIAGNOSIS — Z6841 Body Mass Index (BMI) 40.0 and over, adult: Secondary | ICD-10-CM

## 2020-07-31 MED ORDER — QSYMIA 7.5-46 MG PO CP24: 7.5000 mg | ORAL_CAPSULE | Freq: Every day | ORAL | 0 refills | Status: DC

## 2020-07-31 NOTE — Progress Notes (Signed)
I,Tianna Badgett,acting as a Neurosurgeon for [employer], NP.,have documented all relevant documentation on the behalf of [employer], NP,as directed by  Charlesetta Ivory, NP while in the presence of Charlesetta Ivory, NP.  This visit occurred during the SARS-CoV-2 public health emergency.  Safety protocols were in place, including screening questions prior to the visit, additional usage of staff PPE, and extensive cleaning of exam room while observing appropriate contact time as indicated for disinfecting solutions.  Subjective:     Patient ID: Andrea Mcdaniel , female    DOB: 08/12/1980 , 40 y.o.   MRN: [id number]   Chief Complaint  Patient presents with  . Weight Loss    HPI  She is here today for f/u obesity. She has been taking Qsymia 14 but it was causing side effects. She would like to go down to the 7.5 mg  Dose. She did not have any side-effects with the 7.5 mg. She has not been taking the 11.25  in about 2 months.  Diet: Breakfast she eats spinach, eggs, raspberries and blueberries. Tuna and chicken salad. Force herself to eat dinner.  Exercise: She walks everyday; She walks about a mile each time.    Wt Readings from Last 3 Encounters: 07/31/20 : 247 lb 9.6 oz (112.3 kg) 01/27/20 : 249 lb 6.4 oz (113.1 kg) 12/23/19 : 247 lb 3.2 oz (112.1 kg)     Past Medical History:  Diagnosis Date  . Heart murmur   . Seasonal allergies      Family History  Problem Relation Age of Onset  . Hypertension Mother   . Hypertension Father   . Diabetes Father   . Diabetes Maternal Aunt   . Hypertension Maternal Aunt   . Cancer Maternal Aunt        breast cancer  . Hypertension Maternal Grandmother   . Diabetes Maternal Grandmother   . Cancer Maternal Grandfather        prostate  . Cancer Paternal Grandmother        stomach     Current Outpatient Medications:  .  Phentermine-Topiramate (QSYMIA) 7.5-46 MG CP24, Take by mouth., Disp: , Rfl:  .  ELDERBERRY PO, Take by  mouth. 2 gummies per day, Disp: , Rfl:  .  loratadine (CLARITIN) 10 MG tablet, Take 10 mg by mouth daily., Disp: , Rfl:  .  Multiple Vitamins-Minerals (MULTIVITAMIN WITH MINERALS) tablet, Take 1 tablet by mouth daily., Disp: , Rfl:  .  Vitamin D, Ergocalciferol, (DRISDOL) 1.25 MG (50000 UNIT) CAPS capsule, TAKE 1 CAPSULE BY MOUTH ONE TUESDAYS AND FRIDAYS, Disp: 25 capsule, Rfl: 1   No Known Allergies   Review of Systems  Constitutional: Negative.  Negative for chills and fever.  Respiratory: Negative.  Negative for chest tightness, shortness of breath and wheezing.   Cardiovascular: Negative.  Negative for chest pain and palpitations.  Gastrointestinal: Negative.   Neurological: Negative.      Today's Vitals   07/31/20 1037  BP: 120/76  Pulse: 72  Temp: 98.2 F (36.8 C)  TempSrc: Oral  Weight: 247 lb 9.6 oz (112.3 kg)  Height: 5' 3.4" (1.61 m)   Body mass index is 43.31 kg/m.  Wt Readings from Last 3 Encounters:  07/31/20 247 lb 9.6 oz (112.3 kg)  01/27/20 249 lb 6.4 oz (113.1 kg)  12/23/19 247 lb 3.2 oz (112.1 kg)    Objective:  Physical Exam Constitutional:      Appearance: Normal appearance. She is obese.  HENT:  Head: Normocephalic and atraumatic.  Cardiovascular:     Rate and Rhythm: Normal rate and regular rhythm.     Pulses: Normal pulses.     Heart sounds: Normal heart sounds. No murmur heard.   Pulmonary:     Effort: Pulmonary effort is normal. No respiratory distress.     Breath sounds: Normal breath sounds. No wheezing.  Skin:    Capillary Refill: Capillary refill takes less than 2 seconds.  Neurological:     Mental Status: She is alert and oriented to person, place, and time.         Assessment And Plan:     1. Class 3 severe obesity due to excess calories without serious comorbidity with body mass index (BMI) of 40.0 to 44.9 in adult [facility])  -The patient has not been able to tolerate the 11.25 qsymia. Her side-effects included feeling dizzy.  She would like to decrease her dose back to 7.5 as she was tolerating that dose without side-effects. She has not been taking the 11.25 for the past 2 months. Prescription will be sent to the mail in order pharmacy. Advised her to continue to exercise. Will send her the 7.5 dose. With a follow up in 6-8 weeks.   Follow up: 6-8 weeks    Patient was given opportunity to ask questions. Patient verbalized understanding of the plan and was able to repeat key elements of the plan. All questions were answered to their satisfaction.  Charlesetta Ivory, DNP   I, Cortney Mckinney DNP , have reviewed all documentation for this visit. The documentation on 07/31/20 for the exam, diagnosis, procedures, and orders are all accurate and complete.   IF YOU HAVE BEEN REFERRED TO A SPECIALIST, IT MAY TAKE 1-2 WEEKS TO SCHEDULE/PROCESS THE REFERRAL. IF YOU HAVE NOT HEARD FROM US/SPECIALIST IN TWO WEEKS, PLEASE GIVE [place] A CALL AT [phone].   THE PATIENT IS ENCOURAGED TO PRACTICE SOCIAL DISTANCING DUE TO THE COVID-19 PANDEMIC.

## 2020-07-31 NOTE — Patient Instructions (Signed)

## 2020-08-02 ENCOUNTER — Other Ambulatory Visit: Payer: Self-pay | Admitting: Internal Medicine

## 2020-08-20 ENCOUNTER — Other Ambulatory Visit: Payer: Self-pay

## 2020-08-20 ENCOUNTER — Encounter: Payer: Self-pay | Admitting: Obstetrics & Gynecology

## 2020-08-20 ENCOUNTER — Ambulatory Visit (INDEPENDENT_AMBULATORY_CARE_PROVIDER_SITE_OTHER): Payer: PRIVATE HEALTH INSURANCE | Admitting: Obstetrics & Gynecology

## 2020-08-20 VITALS — BP 132/83 | HR 77 | Ht 63.0 in | Wt 245.0 lb

## 2020-08-20 DIAGNOSIS — Z01419 Encounter for gynecological examination (general) (routine) without abnormal findings: Secondary | ICD-10-CM | POA: Diagnosis not present

## 2020-08-20 DIAGNOSIS — Z975 Presence of (intrauterine) contraceptive device: Secondary | ICD-10-CM

## 2020-08-20 NOTE — Progress Notes (Signed)
GYNECOLOGY ANNUAL PREVENTATIVE CARE ENCOUNTER NOTE  History:     Andrea Mcdaniel is a 40 y.o. [phone] female here for a routine annual gynecologic exam.  Current complaints: none.   Denies abnormal vaginal bleeding, discharge, pelvic pain, problems with intercourse or other gynecologic concerns.    Gynecologic History No LMP recorded. (Menstrual status: IUD).  Mirena placed 07/04/2019. Contraception: IUD Last Pap: 07/04/2019. Results were: normal with negative HPV  Obstetric History OB History  Gravida Para Term Preterm AB Living  3 3 2 1  0 3  SAB IAB Ectopic Multiple Live Births  0 0 0 0      # Outcome Date GA Lbr Len/2nd Weight Sex Delivery Anes PTL Lv  3 Term 09/21/09 [redacted]w[redacted]d  7 lb 10 oz (3.459 kg) M Vag-Spont EPI    2 Term 03/26/08 [redacted]w[redacted]d  7 lb 8 oz (3.402 kg) M Vag-Spont     1 Preterm 09/23/06 [redacted]w[redacted]d  3 lb 6.3 oz (1.539 kg) F Vag-Spont       Past Medical History:  Diagnosis Date  . Heart murmur   . Seasonal allergies     History reviewed. No pertinent surgical history.  Current Outpatient Medications on File Prior to Visit  Medication Sig Dispense Refill  . ELDERBERRY PO Take by mouth. 2 gummies per day    . loratadine (CLARITIN) 10 MG tablet Take 10 mg by mouth daily.    . Multiple Vitamins-Minerals (MULTIVITAMIN WITH MINERALS) tablet Take 1 tablet by mouth daily.    . Phentermine-Topiramate (QSYMIA) 7.5-46 MG CP24 Take 7.5-46 mg by mouth daily. 30 capsule 0  . Vitamin D, Ergocalciferol, (DRISDOL) 1.25 MG (50000 UNIT) CAPS capsule TAKE ONE CAPSULE BY MOUTH ON TUESDAYS AND FRIDAYS 25 capsule 1   No current facility-administered medications on file prior to visit.    No Known Allergies  Social History:  reports that she has never smoked. She has never used smokeless tobacco. She reports that she does not drink alcohol and does not use drugs.  Family History  Problem Relation Age of Onset  . Hypertension Mother   . Hypertension Father   . Diabetes Father   .  Diabetes Maternal Aunt   . Hypertension Maternal Aunt   . Cancer Maternal Aunt        breast cancer  . Hypertension Maternal Grandmother   . Diabetes Maternal Grandmother   . Cancer Maternal Grandfather        prostate  . Cancer Paternal Grandmother        stomach    The following portions of the patient's history were reviewed and updated as appropriate: allergies, current medications, past family history, past medical history, past social history, past surgical history and problem list.  Review of Systems Pertinent items noted in HPI and remainder of comprehensive ROS otherwise negative.  Physical Exam:  BP 132/83   Pulse 77   Ht 5\' 3"  (1.6 m)   Wt 245 lb (111.1 kg)   BMI 43.40 kg/m  CONSTITUTIONAL: Well-developed, well-nourished female in no acute distress.  HENT:  Normocephalic, atraumatic, External right and left ear normal.  EYES: Conjunctivae and EOM are normal. Pupils are equal, round, and reactive to light. No scleral icterus.  NECK: Normal range of motion, supple, no masses.  Normal thyroid.  SKIN: Skin is warm and dry. No rash noted. Not diaphoretic. No erythema. No pallor. MUSCULOSKELETAL: Normal range of motion. No tenderness.  No cyanosis, clubbing, or edema. NEUROLOGIC: Alert and oriented to person, place,  and time. Normal reflexes, muscle tone coordination.  PSYCHIATRIC: Normal mood and affect. Normal behavior. Normal judgment and thought content. CARDIOVASCULAR: Normal heart rate noted, regular rhythm RESPIRATORY: Clear to auscultation bilaterally. Effort and breath sounds normal, no problems with respiration noted. BREASTS: Symmetric in size. No masses, tenderness, skin changes, nipple drainage, or lymphadenopathy bilaterally. Performed in the presence of a chaperone. ABDOMEN: Soft, obese, no distention noted.  No tenderness, rebound or guarding.  PELVIC: Deferred.   Assessment and Plan:      1. IUD (intrauterine device) in place No issues.  2. Well woman  exam with routine gynecological exam Up to date on pap smear screening Mammogram scheduled on 09/18/2020 Routine preventative health maintenance measures emphasized. Please refer to After Visit Summary for other counseling recommendations.      Jaynie Collins, MD, FACOG Obstetrician & Gynecologist, [facility] for [employer], [facility]

## 2020-08-20 NOTE — Patient Instructions (Signed)
Preventive Care 41-41 Years Old, Female Preventive care refers to lifestyle choices and visits with your health care provider that can promote health and wellness. This includes:  A yearly physical exam. This is also called an annual wellness visit.  Regular dental and eye exams.  Immunizations.  Screening for certain conditions.  Healthy lifestyle choices, such as: ? Eating a healthy diet. ? Getting regular exercise. ? Not using drugs or products that contain nicotine and tobacco. ? Limiting alcohol use. What can I expect for my preventive care visit? Physical exam Your health care provider will check your:  Height and weight. These may be used to calculate your BMI (body mass index). BMI is a measurement that tells if you are at a healthy weight.  Heart rate and blood pressure.  Body temperature.  Skin for abnormal spots. Counseling Your health care provider may ask you questions about your:  Past medical problems.  Family's medical history.  Alcohol, tobacco, and drug use.  Emotional well-being.  Home life and relationship well-being.  Sexual activity.  Diet, exercise, and sleep habits.  Work and work Statistician.  Access to firearms.  Method of birth control.  Menstrual cycle.  Pregnancy history. What immunizations do I need? Vaccines are usually given at various ages, according to a schedule. Your health care provider will recommend vaccines for you based on your age, medical history, and lifestyle or other factors, such as travel or where you work.   What tests do I need? Blood tests  Lipid and cholesterol levels. These may be checked every 5 years, or more often if you are over 3 years old.  Hepatitis C test.  Hepatitis B test. Screening  Lung cancer screening. You may have this screening every year starting at age 41 if you have a 30-pack-year history of smoking and currently smoke or have quit within the past 15 years.  Colorectal cancer  screening. ? All adults should have this screening starting at age 41 and continuing until age 17. ? Your health care provider may recommend screening at age 41 if you are at increased risk. ? You will have tests every 1-10 years, depending on your results and the type of screening test.  Diabetes screening. ? This is done by checking your blood sugar (glucose) after you have not eaten for a while (fasting). ? You may have this done every 1-3 years.  Mammogram. ? This may be done every 1-2 years. ? Talk with your health care provider about when you should start having regular mammograms. This may depend on whether you have a family history of breast cancer.  BRCA-related cancer screening. This may be done if you have a family history of breast, ovarian, tubal, or peritoneal cancers.  Pelvic exam and Pap test. ? This may be done every 3 years starting at age 10. ? Starting at age 11, this may be done every 5 years if you have a Pap test in combination with an HPV test. Other tests  STD (sexually transmitted disease) testing, if you are at risk.  Bone density scan. This is done to screen for osteoporosis. You may have this scan if you are at high risk for osteoporosis. Talk with your health care provider about your test results, treatment options, and if necessary, the need for more tests. Follow these instructions at home: Eating and drinking  Eat a diet that includes fresh fruits and vegetables, whole grains, lean protein, and low-fat dairy products.  Take vitamin and mineral supplements  as recommended by your health care provider.  Do not drink alcohol if: ? Your health care provider tells you not to drink. ? You are pregnant, may be pregnant, or are planning to become pregnant.  If you drink alcohol: ? Limit how much you have to 0-1 drink a day. ? Be aware of how much alcohol is in your drink. In the [place] one drink equals one 12 oz bottle of beer (355 mL), one 5 oz glass of  wine (148 mL), or one 1 oz glass of hard liquor (44 mL).   Lifestyle  Take daily care of your teeth and gums. Brush your teeth every morning and night with fluoride toothpaste. Floss one time each day.  Stay active. Exercise for at least 30 minutes 5 or more days each week.  Do not use any products that contain nicotine or tobacco, such as cigarettes, e-cigarettes, and chewing tobacco. If you need help quitting, ask your health care provider.  Do not use drugs.  If you are sexually active, practice safe sex. Use a condom or other form of protection to prevent STIs (sexually transmitted infections).  If you do not wish to become pregnant, use a form of birth control. If you plan to become pregnant, see your health care provider for a prepregnancy visit.  If told by your health care provider, take low-dose aspirin daily starting at age 50.  Find healthy ways to cope with stress, such as: ? Meditation, yoga, or listening to music. ? Journaling. ? Talking to a trusted person. ? Spending time with friends and family. Safety  Always wear your seat belt while driving or riding in a vehicle.  Do not drive: ? If you have been drinking alcohol. Do not ride with someone who has been drinking. ? When you are tired or distracted. ? While texting.  Wear a helmet and other protective equipment during sports activities.  If you have firearms in your house, make sure you follow all gun safety procedures. What's next?  Visit your health care provider once a year for an annual wellness visit.  Ask your health care provider how often you should have your eyes and teeth checked.  Stay up to date on all vaccines. This information is not intended to replace advice given to you by your health care provider. Make sure you discuss any questions you have with your health care provider. Document Revised: 12/24/2019 Document Reviewed: 11/30/2017 Elsevier Patient Education  2021 Elsevier Inc.  

## 2020-09-18 ENCOUNTER — Other Ambulatory Visit: Payer: Self-pay

## 2020-09-18 ENCOUNTER — Ambulatory Visit
Admission: RE | Admit: 2020-09-18 | Discharge: 2020-09-18 | Disposition: A | Discharge status: Home / Self Care | Payer: PRIVATE HEALTH INSURANCE | Source: Ambulatory Visit | Attending: Internal Medicine | Admitting: Internal Medicine

## 2020-09-18 DIAGNOSIS — Z1231 Encounter for screening mammogram for malignant neoplasm of breast: Secondary | ICD-10-CM

## 2020-10-15 ENCOUNTER — Ambulatory Visit: Payer: PRIVATE HEALTH INSURANCE | Admitting: Internal Medicine

## 2020-10-15 ENCOUNTER — Encounter: Payer: Self-pay | Admitting: Internal Medicine

## 2020-10-15 ENCOUNTER — Other Ambulatory Visit: Payer: Self-pay

## 2020-10-15 ENCOUNTER — Ambulatory Visit (INDEPENDENT_AMBULATORY_CARE_PROVIDER_SITE_OTHER): Payer: No Typology Code available for payment source | Admitting: Internal Medicine

## 2020-10-15 VITALS — BP 110/68 | HR 86 | Temp 97.9°F | Ht 63.0 in | Wt 247.0 lb

## 2020-10-15 DIAGNOSIS — M545 Low back pain, unspecified: Secondary | ICD-10-CM | POA: Diagnosis not present

## 2020-10-15 DIAGNOSIS — G8929 Other chronic pain: Secondary | ICD-10-CM

## 2020-10-15 DIAGNOSIS — M542 Cervicalgia: Secondary | ICD-10-CM | POA: Diagnosis not present

## 2020-10-15 DIAGNOSIS — Z6841 Body Mass Index (BMI) 40.0 and over, adult: Secondary | ICD-10-CM

## 2020-10-15 NOTE — Patient Instructions (Addendum)
[place]   Exercising to Lose Weight Exercise is structured, repetitive physical activity to improve fitness and health. Getting regular exercise is important for everyone. It is especially important if you are overweight. Being overweight increases your risk of heart disease, stroke, diabetes, high blood pressure, and several types of cancer.Reducing your calorie intake and exercising can help you lose weight. Exercise is usually categorized as moderate or vigorous intensity. To lose weight, most people need to do a certain amount of moderate-intensity orvigorous-intensity exercise each week. Moderate-intensity exercise  Moderate-intensity exercise is any activity that gets you moving enough to burn at least three times more energy (calories) than if you were sitting. Examples of moderate exercise include: Walking a mile in 15 minutes. Doing light yard work. Biking at an easy pace. Most people should get at least 150 minutes (2 hours and 30 minutes) a week ofmoderate-intensity exercise to maintain their body weight. Vigorous-intensity exercise Vigorous-intensity exercise is any activity that gets you moving enough to burn at least six times more calories than if you were sitting. When you exercise at this intensity, you should be working hard enough that you are not able tocarry on a conversation. Examples of vigorous exercise include: Running. Playing a team sport, such as football, basketball, and soccer. Jumping rope. Most people should get at least 75 minutes (1 hour and 15 minutes) a week ofvigorous-intensity exercise to maintain their body weight. How can exercise affect me? When you exercise enough to burn more calories than you eat, you lose weight. Exercise also reduces body fat and builds muscle. The more muscle you have, the more calories you burn. Exercise also: Improves mood. Reduces stress and tension. Improves your overall fitness, flexibility, and endurance. Increases bone  strength. The amount of exercise you need to lose weight depends on: Your age. The type of exercise. Any health conditions you have. Your overall physical ability. Talk to your health care provider about how much exercise you need and whattypes of activities are safe for you. What actions can I take to lose weight? Nutrition  Make changes to your diet as told by your health care provider or diet and nutrition specialist (dietitian). This may include: Eating fewer calories. Eating more protein. Eating less unhealthy fats. Eating a diet that includes fresh fruits and vegetables, whole grains, low-fat dairy products, and lean protein. Avoiding foods with added fat, salt, and sugar. Drink plenty of water while you exercise to prevent dehydration or heat stroke.  Activity Choose an activity that you enjoy and set realistic goals. Your health care provider can help you make an exercise plan that works for you. Exercise at a moderate or vigorous intensity most days of the week. The intensity of exercise may vary from person to person. You can tell how intense a workout is for you by paying attention to your breathing and heartbeat. Most people will notice their breathing and heartbeat get faster with more intense exercise. Do resistance training twice each week, such as: Push-ups. Sit-ups. Lifting weights. Using resistance bands. Getting short amounts of exercise can be just as helpful as long structured periods of exercise. If you have trouble finding time to exercise, try to include exercise in your daily routine. Get up, stretch, and walk around every 30 minutes throughout the day. Go for a walk during your lunch break. Park your car farther away from your destination. If you take public transportation, get off one stop early and walk the rest of the way. Make phone calls while  standing up and walking around. Take the stairs instead of elevators or escalators. Wear comfortable clothes  and shoes with good support. Do not exercise so much that you hurt yourself, feel dizzy, or get very short of breath. Where to find more information [place] Department of Health and Human Services: ThisPath.fi Centers for Disease Control and Prevention (CDC): [employer]Exhibition.com.br Contact a health care provider: Before starting a new exercise program. If you have questions or concerns about your weight. If you have a medical problem that keeps you from exercising. Get help right away if you have any of the following while exercising: Injury. Dizziness. Difficulty breathing or shortness of breath that does not go away when you stop exercising. Chest pain. Rapid heartbeat. Summary Being overweight increases your risk of heart disease, stroke, diabetes, high blood pressure, and several types of cancer. Losing weight happens when you burn more calories than you eat. Reducing the amount of calories you eat in addition to getting regular moderate or vigorous exercise each week helps you lose weight. This information is not intended to replace advice given to you by your health care provider. Make sure you discuss any questions you have with your healthcare provider. Document Revised: 07/01/2019 Document Reviewed: 07/18/2019 Elsevier Patient Education  2022 [employer].

## 2020-10-15 NOTE — Progress Notes (Signed)
I,Katawbba Wiggins,acting as a Education administrator for Maximino Greenland, MD.,have documented all relevant documentation on the behalf of Maximino Greenland, MD,as directed by  Maximino Greenland, MD while in the presence of Maximino Greenland, MD.  This visit occurred during the SARS-CoV-2 public health emergency.  Safety protocols were in place, including screening questions prior to the visit, additional usage of staff PPE, and extensive cleaning of exam room while observing appropriate contact time as indicated for disinfecting solutions.  Subjective:     Patient ID: Andrea Mcdaniel , female    DOB: [birth year] , 40 y.o.   MRN: [id number]   Chief Complaint  Patient presents with   Obesity    HPI  She is here today for f/u obesity. She is currently taking Qsymia 7.$RemoveBeforeD'5mg'uwIHfGpOjABKrD$  daily. She is not having any issues with the medication. Unfortunately, she had issues with 11.$RemoveBefore'25mg'SvrMBIRZTlHdy$  dose, it caused dizziness.  Her symptoms resolved when she returned to 7.$RemoveBefor'5mg'iRMlrkmruAnq$  dose, which is what she is currently taking.     Past Medical History:  Diagnosis Date   Heart murmur    Seasonal allergies      Family History  Problem Relation Age of Onset   Hypertension Mother    Hypertension Father    Diabetes Father    Breast cancer Maternal Aunt    Diabetes Maternal Aunt    Hypertension Maternal Aunt    Cancer Maternal Aunt        breast cancer   Hypertension Maternal Grandmother    Diabetes Maternal Grandmother    Cancer Maternal Grandfather        prostate   Cancer Paternal Grandmother        stomach     Current Outpatient Medications:    ELDERBERRY PO, Take by mouth. 2 gummies per day, Disp: , Rfl:    levonorgestrel (MIRENA) 20 MCG/DAY IUD, 1 each by Intrauterine route once., Disp: , Rfl:    loratadine (CLARITIN) 10 MG tablet, Take 10 mg by mouth daily., Disp: , Rfl:    Multiple Vitamins-Minerals (MULTIVITAMIN WITH MINERALS) tablet, Take 1 tablet by mouth daily., Disp: , Rfl:    Phentermine-Topiramate (QSYMIA) 7.5-46 MG CP24,  Take 7.5-46 mg by mouth daily., Disp: 30 capsule, Rfl: 0   Vitamin D, Ergocalciferol, (DRISDOL) 1.25 MG (50000 UNIT) CAPS capsule, TAKE ONE CAPSULE BY MOUTH ON TUESDAYS AND FRIDAYS, Disp: 25 capsule, Rfl: 1   No Known Allergies   Review of Systems  Constitutional: Negative.   Respiratory: Negative.    Cardiovascular: Negative.   Gastrointestinal: Negative.   Musculoskeletal:  Positive for back pain and neck pain.       She c/o neck/back pain. States her muscles feel tight. She [facility] and spends a lot of time on the computer. She does exercise, usually cardio. Admits she is not doing any type of weight training. Denies UE/LE weakness/paresthesias.   Psychiatric/Behavioral: Negative.    All other systems reviewed and are negative.   Today's Vitals   10/15/20 0955  BP: 110/68  Pulse: 86  Temp: 97.9 F (36.6 C)  TempSrc: Oral  Weight: 247 lb (112 kg)  Height: $Remove'5\' 3"'rAsRptR$  (1.6 m)   Body mass index is 43.75 kg/m.  Wt Readings from Last 3 Encounters:  10/15/20 247 lb (112 kg)  08/20/20 245 lb (111.1 kg)  07/31/20 247 lb 9.6 oz (112.3 kg)    BP Readings from Last 3 Encounters:  10/15/20 110/68  08/20/20 132/83  07/31/20 120/76    Objective:  Physical Exam  Vitals and nursing note reviewed.  Constitutional:      Appearance: Normal appearance. She is obese.  HENT:     Head: Normocephalic and atraumatic.     Nose:     Comments: Masked     Mouth/Throat:     Comments: Masked  Eyes:     Extraocular Movements: Extraocular movements intact.  Cardiovascular:     Rate and Rhythm: Normal rate and regular rhythm.     Heart sounds: Normal heart sounds.  Pulmonary:     Effort: Pulmonary effort is normal.     Breath sounds: Normal breath sounds.  Musculoskeletal:        General: Tenderness present.     Cervical back: Normal range of motion. Tenderness present.  Skin:    General: Skin is warm.  Neurological:     General: No focal deficit present.     Mental Status: She is alert.   Psychiatric:        Mood and Affect: Mood normal.        Behavior: Behavior normal.        Assessment And Plan:     1. Class 3 severe obesity due to excess calories without serious comorbidity with body mass index (BMI) of 40.0 to 44.9 in adult [facility]) Comments: She is encouraged to incorporate some strength training. We discussed possibly switching to [facility] at future visit if needed. She denies fam hx thyroid CA.  - BMP8+eGFR - TSH  2. Cervicalgia Comments: She was given some stretching exercises to perform. She would benefit from chiro and therapeutic massage.   3. Chronic midline low back pain without sciatica Comments: Please see above.    Patient was given opportunity to ask questions. Patient verbalized understanding of the plan and was able to repeat key elements of the plan. All questions were answered to their satisfaction.   I, Maximino Greenland, MD, have reviewed all documentation for this visit. The documentation on 10/18/20 for the exam, diagnosis, procedures, and orders are all accurate and complete.   IF YOU HAVE BEEN REFERRED TO A SPECIALIST, IT MAY TAKE 1-2 WEEKS TO SCHEDULE/PROCESS THE REFERRAL. IF YOU HAVE NOT HEARD FROM US/SPECIALIST IN TWO WEEKS, PLEASE GIVE [place] A CALL AT [phone].   THE PATIENT IS ENCOURAGED TO PRACTICE SOCIAL DISTANCING DUE TO THE COVID-19 PANDEMIC.

## 2020-10-16 LAB — BMP8+EGFR
BUN/Creatinine Ratio: 14 (ref 9–23)
BUN: 12 mg/dL (ref 6–24)
CO2: 19 mmol/L — ABNORMAL LOW (ref 20–29)
Calcium: 9 mg/dL (ref 8.7–10.2)
Chloride: 106 mmol/L (ref 96–106)
Creatinine, Ser: 0.85 mg/dL (ref 0.57–1.00)
Glucose: 90 mg/dL (ref 65–99)
Potassium: 4.5 mmol/L (ref 3.5–5.2)
Sodium: 139 mmol/L (ref 134–144)
eGFR: 89 mL/min/{1.73_m2} (ref >59)

## 2020-10-16 LAB — TSH: TSH: 0.989 u[IU]/mL (ref 0.450–4.500)

## 2020-12-10 ENCOUNTER — Ambulatory Visit: Payer: No Typology Code available for payment source | Admitting: Internal Medicine

## 2020-12-31 ENCOUNTER — Ambulatory Visit (INDEPENDENT_AMBULATORY_CARE_PROVIDER_SITE_OTHER): Payer: No Typology Code available for payment source | Admitting: Internal Medicine

## 2020-12-31 ENCOUNTER — Encounter: Payer: Self-pay | Admitting: Internal Medicine

## 2020-12-31 ENCOUNTER — Other Ambulatory Visit: Payer: Self-pay

## 2020-12-31 VITALS — BP 110/68 | HR 81 | Temp 98.5°F | Ht 63.0 in | Wt 242.8 lb

## 2020-12-31 DIAGNOSIS — Z23 Encounter for immunization: Secondary | ICD-10-CM | POA: Diagnosis not present

## 2020-12-31 DIAGNOSIS — Z6841 Body Mass Index (BMI) 40.0 and over, adult: Secondary | ICD-10-CM | POA: Diagnosis not present

## 2020-12-31 MED ORDER — QSYMIA 7.5-46 MG PO CP24: 7.5000 mg | ORAL_CAPSULE | Freq: Every day | ORAL | 0 refills | Status: DC

## 2020-12-31 NOTE — Progress Notes (Signed)
I,Tianna Badgett,acting as a Neurosurgeon for Gwynneth Aliment, MD.,have documented all relevant documentation on the behalf of Gwynneth Aliment, MD,as directed by  Gwynneth Aliment, MD while in the presence of Gwynneth Aliment, MD.  This visit occurred during the SARS-CoV-2 public health emergency.  Safety protocols were in place, including screening questions prior to the visit, additional usage of staff PPE, and extensive cleaning of exam room while observing appropriate contact time as indicated for disinfecting solutions.  Subjective:     Patient ID: Andrea Mcdaniel , female    DOB: December 17, 1980 , 40 y.o.   MRN: [id number]   Chief Complaint  Patient presents with   Weight Check    HPI  She is here today for f/u obesity. She is currently taking Qsymia 7.5mg . She is now exercising five days per week. She has no new concerns at this time.    Past Medical History:  Diagnosis Date   Heart murmur    Seasonal allergies      Family History  Problem Relation Age of Onset   Hypertension Mother    Hypertension Father    Diabetes Father    Breast cancer Maternal Aunt    Diabetes Maternal Aunt    Hypertension Maternal Aunt    Cancer Maternal Aunt        breast cancer   Hypertension Maternal Grandmother    Diabetes Maternal Grandmother    Cancer Maternal Grandfather        prostate   Cancer Paternal Grandmother        stomach     Current Outpatient Medications:    ELDERBERRY PO, Take by mouth. 2 gummies per day, Disp: , Rfl:    levonorgestrel (MIRENA) 20 MCG/DAY IUD, 1 each by Intrauterine route once., Disp: , Rfl:    loratadine (CLARITIN) 10 MG tablet, Take 10 mg by mouth daily., Disp: , Rfl:    Multiple Vitamins-Minerals (MULTIVITAMIN WITH MINERALS) tablet, Take 1 tablet by mouth daily., Disp: , Rfl:    Phentermine-Topiramate (QSYMIA) 7.5-46 MG CP24, Take 7.5-46 mg by mouth daily., Disp: 30 capsule, Rfl: 0   Vitamin D, Ergocalciferol, (DRISDOL) 1.25 MG (50000 UNIT) CAPS capsule, TAKE  ONE CAPSULE BY MOUTH ON TUESDAYS AND FRIDAYS, Disp: 25 capsule, Rfl: 1   No Known Allergies   Review of Systems  Constitutional: Negative.   Respiratory: Negative.    Cardiovascular: Negative.   Gastrointestinal: Negative.   Neurological: Negative.     Today's Vitals   12/31/20 0951  BP: 110/68  Pulse: 81  Temp: 98.5 F (36.9 C)  TempSrc: Oral  Weight: 242 lb 12.8 oz (110.1 kg)  Height: 5\' 3"  (1.6 m)   Body mass index is 43.01 kg/m.  Wt Readings from Last 3 Encounters:  12/31/20 242 lb 12.8 oz (110.1 kg)  10/15/20 247 lb (112 kg)  08/20/20 245 lb (111.1 kg)    Objective:  Physical Exam Vitals and nursing note reviewed.  Constitutional:      Appearance: Normal appearance.  HENT:     Head: Normocephalic and atraumatic.     Nose:     Comments: Masked     Mouth/Throat:     Comments: Masked  Cardiovascular:     Rate and Rhythm: Normal rate and regular rhythm.     Heart sounds: Normal heart sounds.  Pulmonary:     Effort: Pulmonary effort is normal.     Breath sounds: Normal breath sounds.  Musculoskeletal:     Cervical back: Normal range  of motion.  Skin:    General: Skin is warm.  Neurological:     General: No focal deficit present.     Mental Status: She is alert.  Psychiatric:        Mood and Affect: Mood normal.        Behavior: Behavior normal.        Assessment And Plan:     1. Class 3 severe obesity due to excess calories without serious comorbidity with body mass index (BMI) of 40.0 to 44.9 in adult [facility]) Comments: She was congratulated on her 5 lb weight loss since July 2022. She will c/w Qsymia 7.5mg  daily.  She will f/u in 8 weeks. She will consider increasing dose at next visit.  - Phentermine-Topiramate (QSYMIA) 7.5-46 MG CP24; Take 7.5-46 mg by mouth daily.  Dispense: 30 capsule; Refill: 0  2. Need for influenza vaccination Comments: She was given the flu vaccine.  - Flu Vaccine QUAD 6+ mos PF IM (Fluarix Quad PF)   Patient was given  opportunity to ask questions. Patient verbalized understanding of the plan and was able to repeat key elements of the plan. All questions were answered to their satisfaction.   I, Gwynneth Aliment, MD, have reviewed all documentation for this visit. The documentation on 12/31/20 for the exam, diagnosis, procedures, and orders are all accurate and complete.   IF YOU HAVE BEEN REFERRED TO A SPECIALIST, IT MAY TAKE 1-2 WEEKS TO SCHEDULE/PROCESS THE REFERRAL. IF YOU HAVE NOT HEARD FROM US/SPECIALIST IN TWO WEEKS, PLEASE GIVE [place] A CALL AT [phone].   THE PATIENT IS ENCOURAGED TO PRACTICE SOCIAL DISTANCING DUE TO THE COVID-19 PANDEMIC.

## 2020-12-31 NOTE — Patient Instructions (Signed)
Obesity, Adult Obesity is having too much body fat. Being obese means that your weight is morethan what is healthy for you. BMI is a number that explains how much body fat you have. If you have a BMI of 30 or more, you are obese. Obesity is often caused by eating or drinking morecalories than your body uses. Changing your lifestyle can help you lose weight. Obesity can cause serious health problems, such as: Stroke. Coronary artery disease (CAD). Type 2 diabetes. Some types of cancer, including cancers of the colon, breast, uterus, and gallbladder. Osteoarthritis. High blood pressure (hypertension). High cholesterol. Sleep apnea. Gallbladder stones. Infertility problems. What are the causes? Eating meals each day that are high in calories, sugar, and fat. Being born with genes that may make you more likely to become obese. Having a medical condition that causes obesity. Taking certain medicines. Sitting a lot (having a sedentary lifestyle). Not getting enough sleep. Drinking a lot of drinks that have sugar in them. What increases the risk? Having a family history of obesity. Being an African American woman. Being a Hispanic man. Living in an area with limited access to: Parks, recreation centers, or sidewalks. Healthy food choices, such as grocery stores and farmers' markets. What are the signs or symptoms? The main sign is having too much body fat. How is this treated? Treatment for this condition often includes changing your lifestyle. Treatment may include: Changing your diet. This may include making a healthy meal plan. Exercise. This may include activity that causes your heart to beat faster (aerobic exercise) and strength training. Work with your doctor to design a program that works for you. Medicine to help you lose weight. This may be used if you are not able to lose 1 pound a week after 6 weeks of healthy eating and more exercise. Treating conditions that cause the  obesity. Surgery. Options may include gastric banding and gastric bypass. This may be done if: Other treatments have not helped to improve your condition. You have a BMI of 40 or higher. You have life-threatening health problems related to obesity. Follow these instructions at home: Eating and drinking  Follow advice from your doctor about what to eat and drink. Your doctor may tell you to: Limit fast food, sweets, and processed snack foods. Choose low-fat options. For example, choose low-fat milk instead of whole milk. Eat 5 or more servings of fruits or vegetables each day. Eat at home more often. This gives you more control over what you eat. Choose healthy foods when you eat out. Learn to read food labels. This will help you learn how much food is in 1 serving. Keep low-fat snacks available. Avoid drinks that have a lot of sugar in them. These include soda, fruit juice, iced tea with sugar, and flavored milk. Drink enough water to keep your pee (urine) pale yellow. Do not go on fad diets.  Physical activity Exercise often, as told by your doctor. Most adults should get up to 150 minutes of moderate-intensity exercise every week.Ask your doctor: What types of exercise are safe for you. How often you should exercise. Warm up and stretch before being active. Do slow stretching after being active (cool down). Rest between times of being active. Lifestyle Work with your doctor and a food expert (dietitian) to set a weight-loss goal that is best for you. Limit your screen time. Find ways to reward yourself that do not involve food. Do not drink alcohol if: Your doctor tells you not to drink.   You are pregnant, may be pregnant, or are planning to become pregnant. If you drink alcohol: Limit how much you use to: 0-1 drink a day for women. 0-2 drinks a day for men. Be aware of how much alcohol is in your drink. In the [place] one drink equals one 12 oz bottle of beer (355 mL), one 5 oz  glass of wine (148 mL), or one 1 oz glass of hard liquor (44 mL). General instructions Keep a weight-loss journal. This can help you keep track of: The food that you eat. How much exercise you get. Take over-the-counter and prescription medicines only as told by your doctor. Take vitamins and supplements only as told by your doctor. Think about joining a support group. Keep all follow-up visits as told by your doctor. This is important. Contact a doctor if: You cannot meet your weight loss goal after you have changed your diet and lifestyle for 6 weeks. Get help right away if you: Are having trouble breathing. Are having thoughts of harming yourself. Summary Obesity is having too much body fat. Being obese means that your weight is more than what is healthy for you. Work with your doctor to set a weight-loss goal. Get regular exercise as told by your doctor. This information is not intended to replace advice given to you by your health care provider. Make sure you discuss any questions you have with your healthcare provider. Document Revised: 11/23/2017 Document Reviewed: 11/23/2017 Elsevier Patient Education  2022 Elsevier Inc.  

## 2021-01-14 ENCOUNTER — Other Ambulatory Visit: Payer: Self-pay | Admitting: Internal Medicine

## 2021-02-02 ENCOUNTER — Encounter: Payer: Self-pay | Admitting: Internal Medicine

## 2021-02-02 ENCOUNTER — Other Ambulatory Visit: Payer: Self-pay

## 2021-02-02 ENCOUNTER — Ambulatory Visit (INDEPENDENT_AMBULATORY_CARE_PROVIDER_SITE_OTHER): Payer: No Typology Code available for payment source | Admitting: Internal Medicine

## 2021-02-02 VITALS — BP 116/74 | HR 78 | Temp 98.4°F | Ht 63.0 in | Wt 240.8 lb

## 2021-02-02 DIAGNOSIS — Z6841 Body Mass Index (BMI) 40.0 and over, adult: Secondary | ICD-10-CM | POA: Diagnosis not present

## 2021-02-02 DIAGNOSIS — Z0000 Encounter for general adult medical examination without abnormal findings: Secondary | ICD-10-CM

## 2021-02-02 LAB — CMP14+EGFR
ALT: 13 IU/L (ref 0–32)
AST: 16 IU/L (ref 0–40)
Albumin/Globulin Ratio: 1.4 (ref 1.2–2.2)
Albumin: 4.1 g/dL (ref 3.8–4.8)
Alkaline Phosphatase: 72 IU/L (ref 44–121)
BUN/Creatinine Ratio: 10 (ref 9–23)
BUN: 10 mg/dL (ref 6–24)
Bilirubin Total: 0.3 mg/dL (ref 0.0–1.2)
CO2: 20 mmol/L (ref 20–29)
Calcium: 9.2 mg/dL (ref 8.7–10.2)
Chloride: 108 mmol/L — ABNORMAL HIGH (ref 96–106)
Creatinine, Ser: 0.96 mg/dL (ref 0.57–1.00)
Globulin, Total: 2.9 g/dL (ref 1.5–4.5)
Glucose: 91 mg/dL (ref 70–99)
Potassium: 4.1 mmol/L (ref 3.5–5.2)
Sodium: 140 mmol/L (ref 134–144)
Total Protein: 7 g/dL (ref 6.0–8.5)
eGFR: 77 mL/min/{1.73_m2} (ref >59)

## 2021-02-02 LAB — CBC
Hematocrit: 41.2 % (ref 34.0–46.6)
Hemoglobin: 13.4 g/dL (ref 11.1–15.9)
MCH: 28.6 pg (ref 26.6–33.0)
MCHC: 32.5 g/dL (ref 31.5–35.7)
MCV: 88 fL (ref 79–97)
Platelets: 260 10*3/uL (ref 150–450)
RBC: 4.68 x10E6/uL (ref 3.77–5.28)
RDW: 13.1 % (ref 11.7–15.4)
WBC: 6.5 10*3/uL (ref 3.4–10.8)

## 2021-02-02 LAB — LIPID PANEL
Chol/HDL Ratio: 2.5 ratio (ref 0.0–4.4)
Cholesterol, Total: 133 mg/dL (ref 100–199)
HDL: 53 mg/dL (ref >39)
LDL Chol Calc (NIH): 70 mg/dL (ref 0–99)
Triglycerides: 44 mg/dL (ref 0–149)
VLDL Cholesterol Cal: 10 mg/dL (ref 5–40)

## 2021-02-02 NOTE — Progress Notes (Signed)
I,Katawbba Wiggins,acting as a Education administrator for Maximino Greenland, MD.,have documented all relevant documentation on the behalf of Maximino Greenland, MD,as directed by  Maximino Greenland, MD while in the presence of Maximino Greenland, MD.  This visit occurred during the SARS-CoV-2 public health emergency.  Safety protocols were in place, including screening questions prior to the visit, additional usage of staff PPE, and extensive cleaning of exam room while observing appropriate contact time as indicated for disinfecting solutions.  Subjective:     Patient ID: Andrea Mcdaniel , female    DOB: January 21, 1981 , 40 y.o.   MRN: [id number]   Chief Complaint  Patient presents with   Annual Exam     HPI  Patient is here for full physical. She is seen at [facility] for her GYN care, Dr. Domenica Reamer. She has no specific concerns at this time.     Past Medical History:  Diagnosis Date   Heart murmur    Seasonal allergies      Family History  Problem Relation Age of Onset   Hypertension Mother    Hypertension Father    Diabetes Father    Breast cancer Maternal Aunt    Diabetes Maternal Aunt    Hypertension Maternal Aunt    Cancer Maternal Aunt        breast cancer   Hypertension Maternal Grandmother    Diabetes Maternal Grandmother    Cancer Maternal Grandfather        prostate   Cancer Paternal Grandmother        stomach     Current Outpatient Medications:    ELDERBERRY PO, Take by mouth. 2 gummies per day, Disp: , Rfl:    levonorgestrel (MIRENA) 20 MCG/DAY IUD, 1 each by Intrauterine route once., Disp: , Rfl:    loratadine (CLARITIN) 10 MG tablet, Take 10 mg by mouth daily., Disp: , Rfl:    Multiple Vitamins-Minerals (MULTIVITAMIN WITH MINERALS) tablet, Take 1 tablet by mouth daily., Disp: , Rfl:    Phentermine-Topiramate (QSYMIA) 7.5-46 MG CP24, Take 7.5-46 mg by mouth daily., Disp: 30 capsule, Rfl: 0   Vitamin D, Ergocalciferol, (DRISDOL) 1.25 MG (50000 UNIT) CAPS capsule, TAKE 1 CAPSULE BY  MOUTH EVERY TUESDAY AND FRIDAY, Disp: 25 capsule, Rfl: 1   No Known Allergies    The patient states she uses IUD for birth control. Last LMP was Patient's last menstrual period was 01/15/2021.. Negative for Dysmenorrhea. Negative for: breast discharge, breast lump(s), breast pain and breast self exam. Associated symptoms include abnormal vaginal bleeding. Pertinent negatives include abnormal bleeding (hematology), anxiety, decreased libido, depression, difficulty falling sleep, dyspareunia, history of infertility, nocturia, sexual dysfunction, sleep disturbances, urinary incontinence, urinary urgency, vaginal discharge and vaginal itching. Diet regular.The patient states her exercise level is  intermittent.  . The patient's tobacco use is:  Social History   Tobacco Use  Smoking Status Never  Smokeless Tobacco Never  . She has been exposed to passive smoke. The patient's alcohol use is:  Social History   Substance and Sexual Activity  Alcohol Use No     Review of Systems  Constitutional: Negative.   HENT: Negative.    Eyes: Negative.   Respiratory: Negative.    Cardiovascular: Negative.   Gastrointestinal: Negative.   Endocrine: Negative.   Genitourinary: Negative.   Musculoskeletal: Negative.   Skin: Negative.   Allergic/Immunologic: Negative.   Neurological: Negative.   Hematological: Negative.   Psychiatric/Behavioral: Negative.    All other systems reviewed and are negative.  Today's Vitals   02/02/21 1049  BP: 116/74  Pulse: 78  Temp: 98.4 F (36.9 C)  Weight: 240 lb 12.8 oz (109.2 kg)  Height: _0  (1.6 m)   Body mass index is 42.66 kg/m.  Wt Readings from Last 3 Encounters:  02/02/21 240 lb 12.8 oz (109.2 kg)  12/31/20 242 lb 12.8 oz (110.1 kg)  10/15/20 247 lb (112 kg)    BP Readings from Last 3 Encounters:  02/02/21 116/74  12/31/20 110/68  10/15/20 110/68    Objective:  Physical Exam Vitals and nursing note reviewed.  Constitutional:       Appearance: Normal appearance.  HENT:     Head: Normocephalic and atraumatic.     Right Ear: Tympanic membrane, ear canal and external ear normal.     Left Ear: Tympanic membrane, ear canal and external ear normal.     Nose:     Comments: Masked     Mouth/Throat:     Comments: Masked  Eyes:     Extraocular Movements: Extraocular movements intact.     Conjunctiva/sclera: Conjunctivae normal.     Pupils: Pupils are equal, round, and reactive to light.  Cardiovascular:     Rate and Rhythm: Normal rate and regular rhythm.     Pulses: Normal pulses.     Heart sounds: Normal heart sounds.  Pulmonary:     Effort: Pulmonary effort is normal.     Breath sounds: Normal breath sounds.  Chest:  Breasts:    Tanner Score is 5.     Right: Normal.     Left: Normal.  Abdominal:     General: Bowel sounds are normal.     Palpations: Abdomen is soft.     Comments: Obese, soft. Difficult to assess organomegaly. Waist beads present  Genitourinary:    Comments: deferred Musculoskeletal:        General: Normal range of motion.     Cervical back: Normal range of motion and neck supple.  Skin:    General: Skin is warm and dry.     Comments: Scattered tattoos  Neurological:     General: No focal deficit present.     Mental Status: She is alert and oriented to person, place, and time.  Psychiatric:        Mood and Affect: Mood normal.        Behavior: Behavior normal.        Assessment And Plan:     1. Encounter for annual physical exam Comments: A full exam was performed. Importance of monthly self breast exams was discussed with the patient. PATIENT IS ADVISED TO GET 30-45 MINUTES REGULAR EXERCISE NO LESS THAN FOUR TO FIVE DAYS PER WEEK - BOTH WEIGHTBEARING EXERCISES AND AEROBIC ARE RECOMMENDED.  PATIENT IS ADVISED TO FOLLOW A HEALTHY DIET WITH AT LEAST SIX FRUITS/VEGGIES PER DAY, DECREASE INTAKE OF RED MEAT, AND TO INCREASE FISH INTAKE TO TWO DAYS PER WEEK.  MEATS/FISH SHOULD NOT BE FRIED,  BAKED OR BROILED IS PREFERABLE.  IT IS ALSO IMPORTANT TO CUT BACK ON YOUR SUGAR INTAKE. PLEASE AVOID ANYTHING WITH ADDED SUGAR, CORN SYRUP OR OTHER SWEETENERS. IF YOU MUST USE A SWEETENER, YOU CAN TRY STEVIA. IT IS ALSO IMPORTANT TO AVOID ARTIFICIALLY SWEETENERS AND DIET BEVERAGES. LASTLY, I SUGGEST WEARING SPF 50 SUNSCREEN ON EXPOSED PARTS AND ESPECIALLY WHEN IN THE DIRECT SUNLIGHT FOR AN EXTENDED PERIOD OF TIME.  PLEASE AVOID FAST FOOD RESTAURANTS AND INCREASE YOUR WATER INTAKE.  - CMP14+EGFR - CBC - Lipid  panel  2. Class 3 severe obesity due to excess calories without serious comorbidity with body mass index (BMI) of 40.0 to 44.9 in adult [facility]) Comments: BMI 42. She was congratulated on her 2 lb weight loss since Sept 2022. She is encouraged to incorporate at least 150 minutes of exercise. She will c/w Qsymia 7.66m daily. Will send refill to mail order pharmacy.   Patient was given opportunity to ask questions. Patient verbalized understanding of the plan and was able to repeat key elements of the plan. All questions were answered to their satisfaction.   I, RMaximino Greenland MD, have reviewed all documentation for this visit. The documentation on 02/02/21 for the exam, diagnosis, procedures, and orders are all accurate and complete.   THE PATIENT IS ENCOURAGED TO PRACTICE SOCIAL DISTANCING DUE TO THE COVID-19 PANDEMIC.

## 2021-02-02 NOTE — Patient Instructions (Signed)
Health Maintenance, Female Adopting a healthy lifestyle and getting preventive care are important in promoting health and wellness. Ask your health care provider about: The right schedule for you to have regular tests and exams. Things you can do on your own to prevent diseases and keep yourself healthy. What should I know about diet, weight, and exercise? Eat a healthy diet  Eat a diet that includes plenty of vegetables, fruits, low-fat dairy products, and lean protein. Do not eat a lot of foods that are high in solid fats, added sugars, or sodium. Maintain a healthy weight Body mass index (BMI) is used to identify weight problems. It estimates body fat based on height and weight. Your health care provider can help determine your BMI and help you achieve or maintain a healthy weight. Get regular exercise Get regular exercise. This is one of the most important things you can do for your health. Most adults should: Exercise for at least 150 minutes each week. The exercise should increase your heart rate and make you sweat (moderate-intensity exercise). Do strengthening exercises at least twice a week. This is in addition to the moderate-intensity exercise. Spend less time sitting. Even light physical activity can be beneficial. Watch cholesterol and blood lipids Have your blood tested for lipids and cholesterol at 41 years of age, then have this test every 5 years. Have your cholesterol levels checked more often if: Your lipid or cholesterol levels are high. You are older than 40 years of age. You are at high risk for heart disease. What should I know about cancer screening? Depending on your health history and family history, you may need to have cancer screening at various ages. This may include screening for: Breast cancer. Cervical cancer. Colorectal cancer. Skin cancer. Lung cancer. What should I know about heart disease, diabetes, and high blood pressure? Blood pressure and heart  disease High blood pressure causes heart disease and increases the risk of stroke. This is more likely to develop in people who have high blood pressure readings, are of African descent, or are overweight. Have your blood pressure checked: Every 3-5 years if you are 18-39 years of age. Every year if you are 40 years old or older. Diabetes Have regular diabetes screenings. This checks your fasting blood sugar level. Have the screening done: Once every three years after age 40 if you are at a normal weight and have a low risk for diabetes. More often and at a younger age if you are overweight or have a high risk for diabetes. What should I know about preventing infection? Hepatitis B If you have a higher risk for hepatitis B, you should be screened for this virus. Talk with your health care provider to find out if you are at risk for hepatitis B infection. Hepatitis C Testing is recommended for: Everyone born from 1945 through 1965. Anyone with known risk factors for hepatitis C. Sexually transmitted infections (STIs) Get screened for STIs, including gonorrhea and chlamydia, if: You are sexually active and are younger than 41 years of age. You are older than 41 years of age and your health care provider tells you that you are at risk for this type of infection. Your sexual activity has changed since you were last screened, and you are at increased risk for chlamydia or gonorrhea. Ask your health care provider if you are at risk. Ask your health care provider about whether you are at high risk for HIV. Your health care provider may recommend a prescription medicine   to help prevent HIV infection. If you choose to take medicine to prevent HIV, you should first get tested for HIV. You should then be tested every 3 months for as long as you are taking the medicine. Pregnancy If you are about to stop having your period (premenopausal) and you may become pregnant, seek counseling before you get  pregnant. Take 400 to 800 micrograms (mcg) of folic acid every day if you become pregnant. Ask for birth control (contraception) if you want to prevent pregnancy. Osteoporosis and menopause Osteoporosis is a disease in which the bones lose minerals and strength with aging. This can result in bone fractures. If you are 65 years old or older, or if you are at risk for osteoporosis and fractures, ask your health care provider if you should: Be screened for bone loss. Take a calcium or vitamin D supplement to lower your risk of fractures. Be given hormone replacement therapy (HRT) to treat symptoms of menopause. Follow these instructions at home: Lifestyle Do not use any products that contain nicotine or tobacco, such as cigarettes, e-cigarettes, and chewing tobacco. If you need help quitting, ask your health care provider. Do not use street drugs. Do not share needles. Ask your health care provider for help if you need support or information about quitting drugs. Alcohol use Do not drink alcohol if: Your health care provider tells you not to drink. You are pregnant, may be pregnant, or are planning to become pregnant. If you drink alcohol: Limit how much you use to 0-1 drink a day. Limit intake if you are breastfeeding. Be aware of how much alcohol is in your drink. In the [place] one drink equals one 12 oz bottle of beer (355 mL), one 5 oz glass of wine (148 mL), or one 1 oz glass of hard liquor (44 mL). General instructions Schedule regular health, dental, and eye exams. Stay current with your vaccines. Tell your health care provider if: You often feel depressed. You have ever been abused or do not feel safe at home. Summary Adopting a healthy lifestyle and getting preventive care are important in promoting health and wellness. Follow your health care provider's instructions about healthy diet, exercising, and getting tested or screened for diseases. Follow your health care provider's  instructions on monitoring your cholesterol and blood pressure. This information is not intended to replace advice given to you by your health care provider. Make sure you discuss any questions you have with your health care provider. Document Revised: 05/29/2020 Document Reviewed: 03/14/2018 Elsevier Patient Education  2022 Elsevier Inc.  

## 2021-04-19 ENCOUNTER — Ambulatory Visit: Payer: No Typology Code available for payment source | Admitting: Internal Medicine

## 2021-04-20 ENCOUNTER — Encounter: Payer: Self-pay | Admitting: Internal Medicine

## 2021-04-20 ENCOUNTER — Other Ambulatory Visit: Payer: Self-pay

## 2021-04-20 MED ORDER — VITAMIN D (ERGOCALCIFEROL) 1.25 MG (50000 UNIT) PO CAPS: ORAL_CAPSULE | ORAL | 1 refills | Status: DC

## 2021-04-27 ENCOUNTER — Other Ambulatory Visit: Payer: Self-pay

## 2021-04-27 ENCOUNTER — Encounter: Payer: Self-pay | Admitting: Internal Medicine

## 2021-04-27 ENCOUNTER — Ambulatory Visit (INDEPENDENT_AMBULATORY_CARE_PROVIDER_SITE_OTHER): Payer: PRIVATE HEALTH INSURANCE | Admitting: Internal Medicine

## 2021-04-27 VITALS — BP 116/80 | HR 70 | Temp 97.8°F | Ht 63.0 in | Wt 241.2 lb

## 2021-04-27 DIAGNOSIS — Z6841 Body Mass Index (BMI) 40.0 and over, adult: Secondary | ICD-10-CM | POA: Diagnosis not present

## 2021-04-27 MED ORDER — QSYMIA 11.25-69 MG PO CP24: 11.2500 mg | ORAL_CAPSULE | Freq: Every day | ORAL | 0 refills | Status: DC

## 2021-04-27 MED ORDER — SEMAGLUTIDE-WEIGHT MANAGEMENT 0.5 MG/0.5ML ~~LOC~~ SOAJ: 0.5000 mg | SUBCUTANEOUS | 1 refills | Status: AC

## 2021-04-27 NOTE — Patient Instructions (Signed)

## 2021-04-27 NOTE — Progress Notes (Signed)
Jeri Cos [place]ittleton,acting as a Neurosurgeon for Gwynneth Aliment, MD.,have documented all relevant documentation on the behalf of Gwynneth Aliment, MD,as directed by  Gwynneth Aliment, MD while in the presence of Gwynneth Aliment, MD.  This visit occurred during the SARS-CoV-2 public health emergency.  Safety protocols were in place, including screening questions prior to the visit, additional usage of staff PPE, and extensive cleaning of exam room while observing appropriate contact time as indicated for disinfecting solutions.  Subjective:     Patient ID: Andrea Mcdaniel , female    DOB: [birth year] , 41 y.o.   MRN: [id number]   Chief Complaint  Patient presents with   Weight Check    HPI  She is here today for f/u obesity. She is currently taking Qsymia 11.25mg  daily. She is now exercising five days per week. She has no new concerns at this time. She reports she weighd 238 this morning without any clothes on.     Past Medical History:  Diagnosis Date   Heart murmur    Seasonal allergies      Family History  Problem Relation Age of Onset   Hypertension Mother    Hypertension Father    Diabetes Father    Breast cancer Maternal Aunt    Diabetes Maternal Aunt    Hypertension Maternal Aunt    Cancer Maternal Aunt        breast cancer   Hypertension Maternal Grandmother    Diabetes Maternal Grandmother    Cancer Maternal Grandfather        prostate   Cancer Paternal Grandmother        stomach     Current Outpatient Medications:    ELDERBERRY PO, Take by mouth. 2 gummies per day, Disp: , Rfl:    levonorgestrel (MIRENA) 20 MCG/DAY IUD, 1 each by Intrauterine route once., Disp: , Rfl:    loratadine (CLARITIN) 10 MG tablet, Take 10 mg by mouth daily., Disp: , Rfl:    Multiple Vitamins-Minerals (MULTIVITAMIN WITH MINERALS) tablet, Take 1 tablet by mouth daily., Disp: , Rfl:    Phentermine-Topiramate (QSYMIA) 7.5-46 MG CP24, Take 7.5-46 mg by mouth daily., Disp: 30 capsule, Rfl: 0    Vitamin D, Ergocalciferol, (DRISDOL) 1.25 MG (50000 UNIT) CAPS capsule, TAKE 1 CAPSULE BY MOUTH EVERY TUESDAY AND FRIDAY, Disp: 25 capsule, Rfl: 1   No Known Allergies   Review of Systems  Constitutional: Negative.   Respiratory: Negative.    Cardiovascular: Negative.   Gastrointestinal: Negative.   Neurological: Negative.   Psychiatric/Behavioral: Negative.      Today's Vitals   04/27/21 1139  BP: 116/80  Pulse: 70  Temp: 97.8 F (36.6 C)  Weight: 241 lb 3.2 oz (109.4 kg)  Height: 5\' 3"  (1.6 m)  PainSc: 0-No pain   Body mass index is 42.73 kg/m.  Wt Readings from Last 3 Encounters:  04/27/21 241 lb 3.2 oz (109.4 kg)  02/02/21 240 lb 12.8 oz (109.2 kg)  12/31/20 242 lb 12.8 oz (110.1 kg)     Objective:  Physical Exam Vitals and nursing note reviewed.  Constitutional:      Appearance: Normal appearance.  HENT:     Head: Normocephalic and atraumatic.     Nose:     Comments: Masked     Mouth/Throat:     Comments: Masked  Eyes:     Extraocular Movements: Extraocular movements intact.  Cardiovascular:     Rate and Rhythm: Normal rate and regular rhythm.  Heart sounds: Normal heart sounds.  Pulmonary:     Effort: Pulmonary effort is normal.     Breath sounds: Normal breath sounds.  Musculoskeletal:     Cervical back: Normal range of motion.  Skin:    General: Skin is warm.  Neurological:     General: No focal deficit present.     Mental Status: She is alert.  Psychiatric:        Mood and Affect: Mood normal.        Behavior: Behavior normal.        Assessment And Plan:     1. Class 3 severe obesity due to excess calories without serious comorbidity with body mass index (BMI) of 40.0 to 44.9 in adult (HCC)  BMI 42. We discussed the use of Wegovy for obesity. It appears she may have hit a plateau. She is encouraged to incorporate more strength training into her workout routine. She denies family/personal h/o thyroid cancer. She was given 0.25mg  samples and  instructed on how to administer the medication. She understands that she will not take her next dose until next week. She is reminded to stop eating when full. She will rto in 8 weeks for re-evaluation.  I will send rx 0.5mg  weekly to start PA process. Possible side effects d/w patient.   Patient was given opportunity to ask questions. Patient verbalized understanding of the plan and was able to repeat key elements of the plan. All questions were answered to their satisfaction.   I, Gwynneth Aliment, MD, have reviewed all documentation for this visit. The documentation on 04/27/21 for the exam, diagnosis, procedures, and orders are all accurate and complete.   IF YOU HAVE BEEN REFERRED TO A SPECIALIST, IT MAY TAKE 1-2 WEEKS TO SCHEDULE/PROCESS THE REFERRAL. IF YOU HAVE NOT HEARD FROM US/SPECIALIST IN TWO WEEKS, PLEASE GIVE [place] A CALL AT [phone] X [phone].   THE PATIENT IS ENCOURAGED TO PRACTICE SOCIAL DISTANCING DUE TO THE COVID-19 PANDEMIC.

## 2021-05-05 ENCOUNTER — Other Ambulatory Visit: Payer: Self-pay

## 2021-05-05 NOTE — Telephone Encounter (Signed)
Prior auth completed for [place], waiting on a response from the [employer].

## 2021-07-22 ENCOUNTER — Ambulatory Visit (INDEPENDENT_AMBULATORY_CARE_PROVIDER_SITE_OTHER): Payer: PRIVATE HEALTH INSURANCE | Admitting: Internal Medicine

## 2021-07-22 ENCOUNTER — Encounter: Payer: Self-pay | Admitting: Internal Medicine

## 2021-07-22 VITALS — BP 124/80 | HR 98 | Temp 98.3°F | Ht 63.0 in | Wt 239.0 lb

## 2021-07-22 DIAGNOSIS — R058 Other specified cough: Secondary | ICD-10-CM

## 2021-07-22 DIAGNOSIS — R0981 Nasal congestion: Secondary | ICD-10-CM | POA: Diagnosis not present

## 2021-07-22 DIAGNOSIS — R0982 Postnasal drip: Secondary | ICD-10-CM

## 2021-07-22 DIAGNOSIS — Z6841 Body Mass Index (BMI) 40.0 and over, adult: Secondary | ICD-10-CM

## 2021-07-22 MED ORDER — TRIAMCINOLONE ACETONIDE 40 MG/ML IJ SUSP
40.0000 mg | Freq: Once | INTRAMUSCULAR | Status: AC
Start: 2021-07-22 — End: 2021-07-22
  Administered 2021-07-22: 40 mg via INTRAMUSCULAR

## 2021-07-22 MED ORDER — QSYMIA 15-92 MG PO CP24: 15.0000 mg | ORAL_CAPSULE | Freq: Every day | ORAL | 2 refills | Status: DC

## 2021-07-22 MED ORDER — LEVOCETIRIZINE DIHYDROCHLORIDE 5 MG PO TABS: 5.0000 mg | ORAL_TABLET | Freq: Every evening | ORAL | 1 refills | Status: DC

## 2021-07-22 MED ORDER — MONTELUKAST SODIUM 10 MG PO TABS: 10.0000 mg | ORAL_TABLET | Freq: Every day | ORAL | 2 refills | Status: DC

## 2021-07-22 MED ORDER — HYDROCODONE BIT-HOMATROP MBR 5-1.5 MG/5ML PO SOLN: 5.0000 mL | Freq: Four times a day (QID) | ORAL | 0 refills | Status: DC | PRN

## 2021-07-22 NOTE — Progress Notes (Signed)
?Jeri Cos Llittleton,acting as a Neurosurgeon for Gwynneth Aliment, MD.,have documented all relevant documentation on the behalf of Gwynneth Aliment, MD,as directed by  Gwynneth Aliment, MD while in the presence of Gwynneth Aliment, MD.  ?This visit occurred during the SARS-CoV-2 public health emergency.  Safety protocols were in place, including screening questions prior to the visit, additional usage of staff PPE, and extensive cleaning of exam room while observing appropriate contact time as indicated for disinfecting solutions. ? ?Subjective:  ?  ? Patient ID: Andrea Mcdaniel , female    DOB: Jul 21, 1980 , 41 y.o.   MRN: [id number] ? ? ?Chief Complaint  ?Patient presents with  ? Weight Check  ? Cough  ? ? ?HPI ? ?Patient presents today for a weight check. The dose of Qsymia was recently increased to 15mg  daily. She is not having any issues with the medication. She is now exercising four days per week.  ? ?Additionally,  she feels like she may have a sinus infection. She has had cough that's keeping her up in night, postnasal drip and runny nose. Her symptoms started last Friday. She has been taking robitussin, mucinex, ginger and peppermint tea to help her throat. She has had minimal relief of her sx. She denies fever/chills, headaches. She is blowing out clear mucus. Sx are exacerbated by being outside for an extended period of time.  ?  ? ?Past Medical History:  ?Diagnosis Date  ? Heart murmur   ? Seasonal allergies   ?  ? ?Family History  ?Problem Relation Age of Onset  ? Hypertension Mother   ? Hypertension Father   ? Diabetes Father   ? Breast cancer Maternal Aunt   ? Diabetes Maternal Aunt   ? Hypertension Maternal Aunt   ? Cancer Maternal Aunt   ?     breast cancer  ? Hypertension Maternal Grandmother   ? Diabetes Maternal Grandmother   ? Cancer Maternal Grandfather   ?     prostate  ? Cancer Paternal Grandmother   ?     stomach  ? ? ? ?Current Outpatient Medications:  ?  ELDERBERRY PO, Take by mouth. 2 gummies per  day, Disp: , Rfl:  ?  HYDROcodone bit-homatropine (HYDROMET) 5-1.5 MG/5ML syrup, Take 5 mLs by mouth every 6 (six) hours as needed for cough., Disp: 120 mL, Rfl: 0 ?  levocetirizine (XYZAL) 5 MG tablet, Take 1 tablet (5 mg total) by mouth every evening., Disp: 30 tablet, Rfl: 1 ?  levonorgestrel (MIRENA) 20 MCG/DAY IUD, 1 each by Intrauterine route once., Disp: , Rfl:  ?  montelukast (SINGULAIR) 10 MG tablet, Take 1 tablet (10 mg total) by mouth daily., Disp: 30 tablet, Rfl: 2 ?  Multiple Vitamins-Minerals (MULTIVITAMIN WITH MINERALS) tablet, Take 1 tablet by mouth daily., Disp: , Rfl:  ?  Phentermine-Topiramate (QSYMIA) 15-92 MG CP24, Take 15 mg by mouth daily., Disp: 30 capsule, Rfl: 2 ?  Vitamin D, Ergocalciferol, (DRISDOL) 1.25 MG (50000 UNIT) CAPS capsule, TAKE 1 CAPSULE BY MOUTH EVERY TUESDAY AND FRIDAY, Disp: 25 capsule, Rfl: 1  ? ?No Known Allergies  ? ?Review of Systems  ?Constitutional: Negative.   ?HENT:  Positive for congestion and postnasal drip.   ?Respiratory:  Positive for cough.   ?Cardiovascular: Negative.   ?Gastrointestinal: Negative.   ?Neurological: Negative.   ?Psychiatric/Behavioral: Negative.     ? ?Today's Vitals  ? 07/22/21 0838  ?BP: 124/80  ?Pulse: 98  ?Temp: 98.3 ?F (36.8 ?C)  ?Weight: 239  lb (108.4 kg)  ?Height: 5\' 3"  (1.6 m)  ?PainSc: 0-No pain  ? ?Body mass index is 42.34 kg/m?.  ?Wt Readings from Last 3 Encounters:  ?07/22/21 239 lb (108.4 kg)  ?04/27/21 241 lb 3.2 oz (109.4 kg)  ?02/02/21 240 lb 12.8 oz (109.2 kg)  ?  ? ?Objective:  ?Physical Exam ?Vitals and nursing note reviewed.  ?Constitutional:   ?   Appearance: Normal appearance. She is obese.  ?HENT:  ?   Head: Normocephalic and atraumatic.  ?   Right Ear: Tympanic membrane, ear canal and external ear normal. There is no impacted cerumen.  ?   Left Ear: Tympanic membrane, ear canal and external ear normal. There is no impacted cerumen.  ?Eyes:  ?   Extraocular Movements: Extraocular movements intact.  ?Cardiovascular:  ?    Rate and Rhythm: Normal rate and regular rhythm.  ?   Heart sounds: Normal heart sounds.  ?Pulmonary:  ?   Effort: Pulmonary effort is normal.  ?   Breath sounds: Normal breath sounds.  ?Musculoskeletal:  ?   Cervical back: Normal range of motion.  ?Skin: ?   General: Skin is warm.  ?Neurological:  ?   General: No focal deficit present.  ?   Mental Status: She is alert.  ?Psychiatric:     ?   Mood and Affect: Mood normal.     ?   Behavior: Behavior normal.  ?  ?Assessment And Plan:  ?   ?1. Class 3 severe obesity due to excess calories without serious comorbidity with body mass index (BMI) of 40.0 to 44.9 in adult [facility]) ?Comments: She was congratulated on her 2lbs weight loss, now on Qsymia 15mg  daily. She will f/u in 8-10 weeks.  ?- Phentermine-Topiramate (QSYMIA) 15-92 MG CP24; Take 15 mg by mouth daily.  Dispense: 30 capsule; Refill: 2 ? ?2. Cough with congestion of paranasal sinus ?Comments: She agrees to PCR COVID testing. She was given Kenalog, 40mg  IM  x1.  She was also given rx Hydromet syrup to use prn.  ?- HYDROcodone bit-homatropine (HYDROMET) 5-1.5 MG/5ML syrup; Take 5 mLs by mouth every 6 (six) hours as needed for cough.  Dispense: 120 mL; Refill: 0 ?- levocetirizine (XYZAL) 5 MG tablet; Take 1 tablet (5 mg total) by mouth every evening.  Dispense: 30 tablet; Refill: 1 ?- Novel Coronavirus, NAA ([facility]) ?- triamcinolone acetonide (KENALOG-40) injection 40 mg ? ?3. Postnasal drip ?Comments: She is advised to avoid dairy during acute sx. I will send rx levocetirizine nightly and montelukast to take in mornings. She will let me know if her sx persist ?- montelukast (SINGULAIR) 10 MG tablet; Take 1 tablet (10 mg total) by mouth daily.  Dispense: 30 tablet; Refill: 2 ?- levocetirizine (XYZAL) 5 MG tablet; Take 1 tablet (5 mg total) by mouth every evening.  Dispense: 30 tablet; Refill: 1 ?- Novel Coronavirus, NAA ([facility]) ?  ?Patient was given opportunity to ask questions. Patient verbalized understanding  of the plan and was able to repeat key elements of the plan. All questions were answered to their satisfaction.  ? ?I, , MD, have reviewed all documentation for this visit. The documentation on 07/22/21 for the exam, diagnosis, procedures, and orders are all accurate and complete.  ? ?IF YOU HAVE BEEN REFERRED TO A SPECIALIST, IT MAY TAKE 1-2 WEEKS TO SCHEDULE/PROCESS THE REFERRAL. IF YOU HAVE NOT HEARD FROM US/SPECIALIST IN TWO WEEKS, PLEASE GIVE A CALL AT [phone].  ? ?THE PATIENT  IS ENCOURAGED TO PRACTICE SOCIAL DISTANCING DUE TO THE COVID-19 PANDEMIC.   ?

## 2021-07-22 NOTE — Patient Instructions (Signed)

## 2021-07-23 LAB — NOVEL CORONAVIRUS, NAA: SARS-CoV-2, NAA: NOT DETECTED

## 2021-10-03 ENCOUNTER — Other Ambulatory Visit: Payer: Self-pay | Admitting: Internal Medicine

## 2021-10-07 ENCOUNTER — Encounter: Payer: Self-pay | Admitting: Internal Medicine

## 2021-10-12 ENCOUNTER — Ambulatory Visit (INDEPENDENT_AMBULATORY_CARE_PROVIDER_SITE_OTHER): Payer: PRIVATE HEALTH INSURANCE | Admitting: Internal Medicine

## 2021-10-12 ENCOUNTER — Encounter: Payer: Self-pay | Admitting: Internal Medicine

## 2021-10-12 VITALS — BP 124/80 | HR 96 | Temp 98.4°F | Ht 63.0 in | Wt 243.0 lb

## 2021-10-12 DIAGNOSIS — R0982 Postnasal drip: Secondary | ICD-10-CM

## 2021-10-12 DIAGNOSIS — Z6841 Body Mass Index (BMI) 40.0 and over, adult: Secondary | ICD-10-CM | POA: Diagnosis not present

## 2021-10-12 MED ORDER — MONTELUKAST SODIUM 10 MG PO TABS: 10.0000 mg | ORAL_TABLET | Freq: Every day | ORAL | 2 refills | Status: DC

## 2021-10-12 NOTE — Progress Notes (Signed)
Jeri Cos Llittleton,acting as a Neurosurgeon for Gwynneth Aliment, MD.,have documented all relevant documentation on the behalf of Gwynneth Aliment, MD,as directed by  Gwynneth Aliment, MD while in the presence of Gwynneth Aliment, MD.    Subjective:     Patient ID: Andrea Mcdaniel , female    DOB: 04-Mar-1981 , 41 y.o.   MRN: [id number]   Chief Complaint  Patient presents with   Weight Check    HPI  Patient presents today for a weight check. Patient reports compliance with her meds.  She is now exercising regularly, plans to start weight training next week. Admits she is not drinking enough water.      Past Medical History:  Diagnosis Date   Heart murmur    Seasonal allergies      Family History  Problem Relation Age of Onset   Hypertension Mother    Hypertension Father    Diabetes Father    Breast cancer Maternal Aunt    Diabetes Maternal Aunt    Hypertension Maternal Aunt    Cancer Maternal Aunt        breast cancer   Hypertension Maternal Grandmother    Diabetes Maternal Grandmother    Cancer Maternal Grandfather        prostate   Cancer Paternal Grandmother        stomach     Current Outpatient Medications:    ELDERBERRY PO, Take by mouth. 2 gummies per day, Disp: , Rfl:    levonorgestrel (MIRENA) 20 MCG/DAY IUD, 1 each by Intrauterine route once., Disp: , Rfl:    Multiple Vitamins-Minerals (MULTIVITAMIN WITH MINERALS) tablet, Take 1 tablet by mouth daily., Disp: , Rfl:    Phentermine-Topiramate (QSYMIA) 15-92 MG CP24, Take 15 mg by mouth daily., Disp: 30 capsule, Rfl: 2   Vitamin D, Ergocalciferol, (DRISDOL) 1.25 MG (50000 UNIT) CAPS capsule, TAKE 1 CAPSULE BY MOUTH EVERY TUESDAY AND FRIDAY, Disp: 25 capsule, Rfl: 1   levocetirizine (XYZAL) 5 MG tablet, Take 1 tablet (5 mg total) by mouth every evening. (Patient not taking: Reported on 10/12/2021), Disp: 30 tablet, Rfl: 1   montelukast (SINGULAIR) 10 MG tablet, TAKE 1 TABLET(10 MG) BY MOUTH DAILY, Disp: 30 tablet, Rfl: 1    No Known Allergies   Review of Systems  Constitutional: Negative.   HENT:  Positive for postnasal drip.   Respiratory: Negative.    Cardiovascular: Negative.   Gastrointestinal: Negative.   Neurological: Negative.   Psychiatric/Behavioral: Negative.       Today's Vitals   10/12/21 1444  BP: 124/80  Pulse: 96  Temp: 98.4 F (36.9 C)  Weight: 243 lb (110.2 kg)  Height: 5\' 3"  (1.6 m)  PainSc: 0-No pain   Body mass index is 43.05 kg/m.  Wt Readings from Last 3 Encounters:  10/12/21 243 lb (110.2 kg)  07/22/21 239 lb (108.4 kg)  04/27/21 241 lb 3.2 oz (109.4 kg)     Objective:  Physical Exam Vitals and nursing note reviewed.  Constitutional:      Appearance: Normal appearance. She is obese.  HENT:     Head: Normocephalic and atraumatic.  Eyes:     Extraocular Movements: Extraocular movements intact.  Cardiovascular:     Rate and Rhythm: Normal rate and regular rhythm.     Heart sounds: Normal heart sounds.  Pulmonary:     Effort: Pulmonary effort is normal.     Breath sounds: Normal breath sounds.  Musculoskeletal:     Cervical back: Normal  range of motion.  Skin:    General: Skin is warm.  Neurological:     General: No focal deficit present.     Mental Status: She is alert.  Psychiatric:        Mood and Affect: Mood normal.        Behavior: Behavior normal.      Assessment And Plan:     1. Class 3 severe obesity due to excess calories without serious comorbidity with body mass index (BMI) of 40.0 to 44.9 in adult [facility]) Comments: She is aware of 4lb weight gain since April 2023. She is reminded diet/exercise changes are a part of an effective weight loss program. She will c/w Qsymia. F/u 8 weeks.   2. Postnasal drip Comments: She is advised to avoid dairy during acute sx. I will send rx levocetirizine nightly and montelukast to take in mornings. She will let me know if her sx persist   Patient was given opportunity to ask questions. Patient verbalized  understanding of the plan and was able to repeat key elements of the plan. All questions were answered to their satisfaction.   I, Gwynneth Aliment, MD, have reviewed all documentation for this visit. The documentation on 10/12/21 for the exam, diagnosis, procedures, and orders are all accurate and complete.   IF YOU HAVE BEEN REFERRED TO A SPECIALIST, IT MAY TAKE 1-2 WEEKS TO SCHEDULE/PROCESS THE REFERRAL. IF YOU HAVE NOT HEARD FROM US/SPECIALIST IN TWO WEEKS, PLEASE GIVE [place] A CALL AT [phone].   THE PATIENT IS ENCOURAGED TO PRACTICE SOCIAL DISTANCING DUE TO THE COVID-19 PANDEMIC.

## 2021-10-12 NOTE — Patient Instructions (Signed)

## 2021-10-17 ENCOUNTER — Other Ambulatory Visit: Payer: Self-pay | Admitting: Internal Medicine

## 2021-10-17 DIAGNOSIS — R0982 Postnasal drip: Secondary | ICD-10-CM

## 2021-12-07 ENCOUNTER — Telehealth (INDEPENDENT_AMBULATORY_CARE_PROVIDER_SITE_OTHER): Payer: PRIVATE HEALTH INSURANCE | Admitting: Internal Medicine

## 2021-12-07 ENCOUNTER — Encounter: Payer: Self-pay | Admitting: Internal Medicine

## 2021-12-07 VITALS — Ht 63.0 in | Wt 239.8 lb

## 2021-12-07 DIAGNOSIS — Z6841 Body Mass Index (BMI) 40.0 and over, adult: Secondary | ICD-10-CM | POA: Diagnosis not present

## 2021-12-07 DIAGNOSIS — M542 Cervicalgia: Secondary | ICD-10-CM | POA: Diagnosis not present

## 2021-12-07 DIAGNOSIS — J301 Allergic rhinitis due to pollen: Secondary | ICD-10-CM

## 2021-12-07 MED ORDER — MONTELUKAST SODIUM 10 MG PO TABS: ORAL_TABLET | ORAL | 1 refills | Status: AC

## 2021-12-07 MED ORDER — LEVOCETIRIZINE DIHYDROCHLORIDE 5 MG PO TABS: 5.0000 mg | ORAL_TABLET | Freq: Every evening | ORAL | 1 refills | Status: AC

## 2021-12-07 NOTE — Patient Instructions (Signed)

## 2021-12-07 NOTE — Progress Notes (Signed)
Virtual Visit via Video   This visit type was conducted due to national recommendations for restrictions regarding the COVID-19 Pandemic (e.g. social distancing) in an effort to limit this patient's exposure and mitigate transmission in our community.  Due to her co-morbid illnesses, this patient is at least at moderate risk for complications without adequate follow up.  This format is felt to be most appropriate for this patient at this time.  All issues noted in this document were discussed and addressed.  A limited physical exam was performed with this format.    This visit type was conducted due to national recommendations for restrictions regarding the COVID-19 Pandemic (e.g. social distancing) in an effort to limit this patient's exposure and mitigate transmission in our community.  Patients identity confirmed using two different identifiers.  This format is felt to be most appropriate for this patient at this time.  All issues noted in this document were discussed and addressed.  No physical exam was performed (except for noted visual exam findings with Video Visits).    Date:  12/18/2021   ID:  Andrea Mcdaniel, DOB [birth year], MRN [id number]  Patient Location:  Home  Provider location:   Office    Chief Complaint:  "I have weight f/u"  History of Present Illness:    Andrea Mcdaniel is a 41 y.o. female who presents via video conferencing for a telehealth visit today.    The patient does not have symptoms concerning for COVID-19 infection (fever, chills, cough, or new shortness of breath).   She presents today for virtual visit. She prefers this method of contact due to COVID-19 pandemic.  She presents today for  a weight check. She has been taking Qsymia 15mg  without any issues. She has not had any side effects from the medication.        Past Medical History:  Diagnosis Date   Heart murmur    Seasonal allergies    History reviewed. No pertinent surgical history.    Current Meds  Medication Sig   ELDERBERRY PO Take by mouth. 2 gummies per day   levonorgestrel (MIRENA) 20 MCG/DAY IUD 1 each by Intrauterine route once.   Multiple Vitamins-Minerals (MULTIVITAMIN WITH MINERALS) tablet Take 1 tablet by mouth daily.   Phentermine-Topiramate (QSYMIA) 15-92 MG CP24 Take 15 mg by mouth daily.   Vitamin D, Ergocalciferol, (DRISDOL) 1.25 MG (50000 UNIT) CAPS capsule TAKE 1 CAPSULE BY MOUTH EVERY TUESDAY AND FRIDAY   [DISCONTINUED] levocetirizine (XYZAL) 5 MG tablet Take 1 tablet (5 mg total) by mouth every evening.   [DISCONTINUED] montelukast (SINGULAIR) 10 MG tablet TAKE 1 TABLET(10 MG) BY MOUTH DAILY     Allergies:   Patient has no known allergies.   Social History   Tobacco Use   Smoking status: Never   Smokeless tobacco: Never  Substance Use Topics   Alcohol use: No   Drug use: No     Family Hx: The patient's family history includes Breast cancer in her maternal aunt; Cancer in her maternal aunt, maternal grandfather, and paternal grandmother; Diabetes in her father, maternal aunt, and maternal grandmother; Hypertension in her father, maternal aunt, maternal grandmother, and mother.  ROS:   Please see the history of present illness.    Review of Systems  Constitutional: Negative.   Respiratory: Negative.    Cardiovascular: Negative.   Gastrointestinal: Negative.   Musculoskeletal:  Positive for neck pain.  Neurological: Negative.   Psychiatric/Behavioral: Negative.      All other systems reviewed and  are negative.   Labs/Other Tests and Data Reviewed:    Recent Labs: 02/02/2021: ALT 13; BUN 10; Creatinine, Ser 0.96; Hemoglobin 13.4; Platelets 260; Potassium 4.1; Sodium 140   Recent Lipid Panel Lab Results  Component Value Date/Time   CHOL 133 02/02/2021 11:31 AM   TRIG 44 02/02/2021 11:31 AM   HDL 53 02/02/2021 11:31 AM   CHOLHDL 2.5 02/02/2021 11:31 AM   CHOLHDL 3 02/02/2016 08:51 AM   LDLCALC 70 02/02/2021 11:31 AM      Wt  Readings from Last 3 Encounters:  12/07/21 239 lb 12.8 oz (108.8 kg)  10/12/21 243 lb (110.2 kg)  07/22/21 239 lb (108.4 kg)    Exam:    Vital Signs:  Ht 5\' 3"  (1.6 m)   Wt 239 lb 12.8 oz (108.8 kg)   BMI 42.48 kg/m     Physical Exam Vitals and nursing note reviewed.  Constitutional:      Appearance: Normal appearance.  HENT:     Head: Normocephalic and atraumatic.  Eyes:     Extraocular Movements: Extraocular movements intact.  Pulmonary:     Effort: Pulmonary effort is normal.  Musculoskeletal:     Cervical back: Normal range of motion.  Neurological:     Mental Status: She is alert and oriented to person, place, and time.  Psychiatric:        Mood and Affect: Affect normal.     ASSESSMENT & PLAN:    1. Class 3 severe obesity due to excess calories without serious comorbidity with body mass index (BMI) of 40.0 to 44.9 in adult [facility]) Comments: She was congratulated on her 4lb weight loss.  She will c/w Qsymia 15mg  and f/u in 8 -10 weeks. Adivsed to aim for at least 150 minutes of exercise/week.   2. Cervicalgia Comments: She should perform stretching exercises, she will let me know if her sx persist.   3. Non-seasonal allergic rhinitis due to pollen Comments: She is advised to avoid dairy during acute sx. I will send rx levocetirizine nightly and montelukast to take in mornings. She will let me know if her sx persist - montelukast (SINGULAIR) 10 MG tablet; TAKE 1 TABLET(10 MG) BY MOUTH DAILY  Dispense: 90 tablet; Refill: 1 - levocetirizine (XYZAL) 5 MG tablet; Take 1 tablet (5 mg total) by mouth every evening.  Dispense: 90 tablet; Refill: 1    COVID-19 Education: The signs and symptoms of COVID-19 were discussed with the patient and how to seek care for testing (follow up with PCP or arrange E-visit).  The importance of social distancing was discussed today.  Patient Risk:   After full review of this patients clinical status, I feel that they are at least  moderate risk at this time.  Time:   Today, I have spent 11 minutes/ 34 seconds with the patient with telehealth technology discussing above diagnoses.     Medication Adjustments/Labs and Tests Ordered: Current medicines are reviewed at length with the patient today.  Concerns regarding medicines are outlined above.   Tests Ordered: No orders of the defined types were placed in this encounter.   Medication Changes: Meds ordered this encounter  Medications   montelukast (SINGULAIR) 10 MG tablet    Sig: TAKE 1 TABLET(10 MG) BY MOUTH DAILY    Dispense:  90 tablet    Refill:  1   levocetirizine (XYZAL) 5 MG tablet    Sig: Take 1 tablet (5 mg total) by mouth every evening.    Dispense:  90 tablet    Refill:  1    Disposition:  Follow up in 8 week(s)  Signed, Gwynneth Aliment, MD

## 2022-02-14 ENCOUNTER — Other Ambulatory Visit: Payer: Self-pay | Admitting: Internal Medicine

## 2022-02-14 ENCOUNTER — Ambulatory Visit (INDEPENDENT_AMBULATORY_CARE_PROVIDER_SITE_OTHER): Payer: PRIVATE HEALTH INSURANCE | Admitting: Internal Medicine

## 2022-02-14 ENCOUNTER — Encounter: Payer: Self-pay | Admitting: Internal Medicine

## 2022-02-14 VITALS — BP 132/70 | HR 90 | Temp 97.7°F | Ht 63.0 in | Wt 239.8 lb

## 2022-02-14 DIAGNOSIS — Z23 Encounter for immunization: Secondary | ICD-10-CM | POA: Diagnosis not present

## 2022-02-14 DIAGNOSIS — R030 Elevated blood-pressure reading, without diagnosis of hypertension: Secondary | ICD-10-CM

## 2022-02-14 DIAGNOSIS — Z0000 Encounter for general adult medical examination without abnormal findings: Secondary | ICD-10-CM | POA: Diagnosis not present

## 2022-02-14 DIAGNOSIS — E559 Vitamin D deficiency, unspecified: Secondary | ICD-10-CM

## 2022-02-14 DIAGNOSIS — Z6841 Body Mass Index (BMI) 40.0 and over, adult: Secondary | ICD-10-CM | POA: Diagnosis not present

## 2022-02-14 DIAGNOSIS — Z1231 Encounter for screening mammogram for malignant neoplasm of breast: Secondary | ICD-10-CM

## 2022-02-14 NOTE — Progress Notes (Signed)
Barnet Glasgow Martin,acting as a Education administrator for Maximino Greenland, MD.,have documented all relevant documentation on the behalf of Maximino Greenland, MD,as directed by  Maximino Greenland, MD while in the presence of Maximino Greenland, MD.   Subjective:     Patient ID: Andrea Mcdaniel , female    DOB: 20-Jan-1981 , 41 y.o.   MRN: [id number]   Chief Complaint  Patient presents with   Annual Exam    HPI  Patient presents today for HM.  She is followed by GYN for her pelvic exams.  Patient reports compliance with medications and has no other issues today. She denies having any headaches, chest pain and shortness of breath.   BP Readings from Last 3 Encounters: 02/14/22 : 132/70 10/12/21 : 124/80 07/22/21 : 124/80        Past Medical History:  Diagnosis Date   Heart murmur    Seasonal allergies      Family History  Problem Relation Age of Onset   Hypertension Mother    Hypertension Father    Diabetes Father    Breast cancer Maternal Aunt    Diabetes Maternal Aunt    Hypertension Maternal Aunt    Cancer Maternal Aunt        breast cancer   Hypertension Maternal Grandmother    Diabetes Maternal Grandmother    Cancer Maternal Grandfather        prostate   Cancer Paternal Grandmother        stomach     Current Outpatient Medications:    ELDERBERRY PO, Take by mouth. 2 gummies per day, Disp: , Rfl:    levocetirizine (XYZAL) 5 MG tablet, Take 1 tablet (5 mg total) by mouth every evening., Disp: 90 tablet, Rfl: 1   levonorgestrel (MIRENA) 20 MCG/DAY IUD, 1 each by Intrauterine route once., Disp: , Rfl:    montelukast (SINGULAIR) 10 MG tablet, TAKE 1 TABLET(10 MG) BY MOUTH DAILY, Disp: 90 tablet, Rfl: 1   Multiple Vitamins-Minerals (MULTIVITAMIN WITH MINERALS) tablet, Take 1 tablet by mouth daily., Disp: , Rfl:    Phentermine-Topiramate (QSYMIA) 15-92 MG CP24, Take 15 mg by mouth daily., Disp: 30 capsule, Rfl: 2   Vitamin D, Ergocalciferol, (DRISDOL) 1.25 MG (50000 UNIT) CAPS capsule, TAKE  1 CAPSULE BY MOUTH EVERY TUESDAY AND FRIDAY, Disp: 25 capsule, Rfl: 1   No Known Allergies    The patient states she uses IUD for birth control. Last LMP was Patient's last menstrual period was 02/11/2022 (exact date).. Negative for Dysmenorrhea. Negative for: breast discharge, breast lump(s), breast pain and breast self exam. Associated symptoms include abnormal vaginal bleeding. Pertinent negatives include abnormal bleeding (hematology), anxiety, decreased libido, depression, difficulty falling sleep, dyspareunia, history of infertility, nocturia, sexual dysfunction, sleep disturbances, urinary incontinence, urinary urgency, vaginal discharge and vaginal itching. Diet regular.The patient states her exercise level is  intermittent.  . The patient's tobacco use is:  Social History   Tobacco Use  Smoking Status Never  Smokeless Tobacco Never  . She has been exposed to passive smoke. The patient's alcohol use is:  Social History   Substance and Sexual Activity  Alcohol Use No   Review of Systems  Constitutional: Negative.   HENT: Negative.    Eyes: Negative.   Respiratory: Negative.    Cardiovascular: Negative.   Endocrine: Negative.   Genitourinary: Negative.   Musculoskeletal: Negative.   Skin: Negative.   Allergic/Immunologic: Negative.   Neurological: Negative.   Hematological: Negative.   Psychiatric/Behavioral: Negative.  Today's Vitals   02/14/22 1124  BP: 132/70  Pulse: 90  Temp: 97.7 F (36.5 C)  TempSrc: Oral  Weight: 239 lb 12.8 oz (108.8 kg)  Height: _0  (1.6 m)  PainSc: 0-No pain   Body mass index is 42.48 kg/m.  Wt Readings from Last 3 Encounters:  02/14/22 239 lb 12.8 oz (108.8 kg)  12/07/21 239 lb 12.8 oz (108.8 kg)  10/12/21 243 lb (110.2 kg)    Objective:  Physical Exam Vitals and nursing note reviewed.  Constitutional:      Appearance: Normal appearance. She is obese.  HENT:     Head: Normocephalic and atraumatic.     Right Ear:  Tympanic membrane, ear canal and external ear normal.     Left Ear: Tympanic membrane, ear canal and external ear normal.     Nose:     Comments: Masked     Mouth/Throat:     Comments: Masked  Eyes:     Extraocular Movements: Extraocular movements intact.     Conjunctiva/sclera: Conjunctivae normal.     Pupils: Pupils are equal, round, and reactive to light.  Cardiovascular:     Rate and Rhythm: Normal rate and regular rhythm.     Pulses: Normal pulses.     Heart sounds: Normal heart sounds.  Pulmonary:     Effort: Pulmonary effort is normal.     Breath sounds: Normal breath sounds.  Chest:  Breasts:    Tanner Score is 5.     Right: Normal.     Left: Normal.  Abdominal:     General: Abdomen is flat. Bowel sounds are normal.     Palpations: Abdomen is soft.  Genitourinary:    Comments: deferred Musculoskeletal:        General: Normal range of motion.     Cervical back: Normal range of motion and neck supple.  Skin:    General: Skin is warm and dry.  Neurological:     General: No focal deficit present.     Mental Status: She is alert and oriented to person, place, and time.  Psychiatric:        Mood and Affect: Mood normal.        Behavior: Behavior normal.         Assessment And Plan:     1. Annual physical exam Comments: A full exam was performed. Importance of monthly self breast exams was discussed with the pt.  She is due for annual mammogram, she plans to schedule today.  PATIENT IS ADVISED TO GET 30-45 MINUTES REGULAR EXERCISE NO LESS THAN FOUR TO FIVE DAYS PER WEEK - BOTH WEIGHTBEARING EXERCISES AND AEROBIC ARE RECOMMENDED.  PATIENT IS ADVISED TO FOLLOW A HEALTHY DIET WITH AT LEAST SIX FRUITS/VEGGIES PER DAY, DECREASE INTAKE OF RED MEAT, AND TO INCREASE FISH INTAKE TO TWO DAYS PER WEEK.  MEATS/FISH SHOULD NOT BE FRIED, BAKED OR BROILED IS PREFERABLE.  IT IS ALSO IMPORTANT TO CUT BACK ON YOUR SUGAR INTAKE. PLEASE AVOID ANYTHING WITH ADDED SUGAR, CORN SYRUP OR OTHER  SWEETENERS. IF YOU MUST USE A SWEETENER, YOU CAN TRY STEVIA. IT IS ALSO IMPORTANT TO AVOID ARTIFICIALLY SWEETENERS AND DIET BEVERAGES. LASTLY, I SUGGEST WEARING SPF 50 SUNSCREEN ON EXPOSED PARTS AND ESPECIALLY WHEN IN THE DIRECT SUNLIGHT FOR AN EXTENDED PERIOD OF TIME.  PLEASE AVOID FAST [employer]UR[employer]S AND INCREASE YOUR WATER INTAKE. - CBC no Diff - Lipid panel - CMP14+EGFR  2. Elevated blood pressure reading Comments: She is aware of BP >  130/70. Advised to follow low sodium diet. Encouraged to follow DASH eating plan. Will recheck at next visit.  3. Vitamin D deficiency disease Comments: I will check a vitamin D level and supplement as needed. - Vitamin D (25 hydroxy)  4. Class 3 severe obesity due to excess calories without serious comorbidity with body mass index (BMI) of 40.0 to 44.9 in adult [facility]) She will c/w Qsymia. I will send refills to mail order pharmacy. She is encouraged to resume regular exercise regimen, aiming for at least 150 minutes of exercise per week.   5. Need for influenza vaccination - Flu Vaccine QUAD 6+ mos PF IM (Fluarix Quad PF)  Patient was given opportunity to ask questions. Patient verbalized understanding of the plan and was able to repeat key elements of the plan. All questions were answered to their satisfaction.   I, Maximino Greenland, MD, have reviewed all documentation for this visit. The documentation on 02/14/22 for the exam, diagnosis, procedures, and orders are all accurate and complete.   THE PATIENT IS ENCOURAGED TO PRACTICE SOCIAL DISTANCING DUE TO THE COVID-19 PANDEMIC.

## 2022-02-14 NOTE — Patient Instructions (Signed)

## 2022-02-15 LAB — LIPID PANEL
Chol/HDL Ratio: 2.6 ratio (ref 0.0–4.4)
Cholesterol, Total: 137 mg/dL (ref 100–199)
HDL: 53 mg/dL (ref >39)
LDL Chol Calc (NIH): 72 mg/dL (ref 0–99)
Triglycerides: 54 mg/dL (ref 0–149)
VLDL Cholesterol Cal: 12 mg/dL (ref 5–40)

## 2022-02-15 LAB — CBC
Hematocrit: 40.7 % (ref 34.0–46.6)
Hemoglobin: 13.6 g/dL (ref 11.1–15.9)
MCH: 29.1 pg (ref 26.6–33.0)
MCHC: 33.4 g/dL (ref 31.5–35.7)
MCV: 87 fL (ref 79–97)
Platelets: 262 10*3/uL (ref 150–450)
RBC: 4.67 x10E6/uL (ref 3.77–5.28)
RDW: 13.2 % (ref 11.7–15.4)
WBC: 5.2 10*3/uL (ref 3.4–10.8)

## 2022-02-15 LAB — CMP14+EGFR
ALT: 13 IU/L (ref 0–32)
AST: 15 IU/L (ref 0–40)
Albumin/Globulin Ratio: 1.6 (ref 1.2–2.2)
Albumin: 4.2 g/dL (ref 3.9–4.9)
Alkaline Phosphatase: 74 IU/L (ref 44–121)
BUN/Creatinine Ratio: 13 (ref 9–23)
BUN: 12 mg/dL (ref 6–24)
Bilirubin Total: 0.3 mg/dL (ref 0.0–1.2)
CO2: 20 mmol/L (ref 20–29)
Calcium: 8.9 mg/dL (ref 8.7–10.2)
Chloride: 105 mmol/L (ref 96–106)
Creatinine, Ser: 0.94 mg/dL (ref 0.57–1.00)
Globulin, Total: 2.7 g/dL (ref 1.5–4.5)
Glucose: 87 mg/dL (ref 70–99)
Potassium: 4 mmol/L (ref 3.5–5.2)
Sodium: 139 mmol/L (ref 134–144)
Total Protein: 6.9 g/dL (ref 6.0–8.5)
eGFR: 78 mL/min/{1.73_m2} (ref >59)

## 2022-02-15 LAB — VITAMIN D 25 HYDROXY (VIT D DEFICIENCY, FRACTURES): Vit D, 25-Hydroxy: 83.8 ng/mL (ref 30.0–100.0)

## 2022-03-11 ENCOUNTER — Ambulatory Visit
Admission: RE | Admit: 2022-03-11 | Discharge: 2022-03-11 | Disposition: A | Discharge status: Home / Self Care | Payer: PRIVATE HEALTH INSURANCE | Source: Ambulatory Visit | Attending: Internal Medicine | Admitting: Internal Medicine

## 2022-03-11 DIAGNOSIS — Z1231 Encounter for screening mammogram for malignant neoplasm of breast: Secondary | ICD-10-CM

## 2022-05-09 ENCOUNTER — Ambulatory Visit: Payer: PRIVATE HEALTH INSURANCE | Admitting: Internal Medicine

## 2022-06-15 ENCOUNTER — Other Ambulatory Visit: Payer: Self-pay | Admitting: Internal Medicine

## 2022-07-14 ENCOUNTER — Encounter: Payer: Self-pay | Admitting: Internal Medicine

## 2022-07-14 ENCOUNTER — Ambulatory Visit: Payer: PRIVATE HEALTH INSURANCE | Admitting: Internal Medicine

## 2022-07-14 ENCOUNTER — Other Ambulatory Visit: Payer: Self-pay

## 2022-07-14 VITALS — BP 118/78 | HR 81 | Temp 97.9°F | Ht 63.0 in | Wt 236.8 lb

## 2022-07-14 DIAGNOSIS — J301 Allergic rhinitis due to pollen: Secondary | ICD-10-CM | POA: Diagnosis not present

## 2022-07-14 DIAGNOSIS — Z6841 Body Mass Index (BMI) 40.0 and over, adult: Secondary | ICD-10-CM

## 2022-07-14 MED ORDER — ZEPBOUND 2.5 MG/0.5ML ~~LOC~~ SOAJ: 2.5000 mg | SUBCUTANEOUS | 0 refills | Status: DC

## 2022-07-14 NOTE — Patient Instructions (Signed)

## 2022-07-14 NOTE — Progress Notes (Signed)
I,Victoria T Hamilton,acting as a scribe for Gwynneth Aliment, MD.,have documented all relevant documentation on the behalf of Gwynneth Aliment, MD,as directed by  Gwynneth Aliment, MD while in the presence of Gwynneth Aliment, MD.    Subjective:     Patient ID: Andrea Mcdaniel , female    DOB: Oct 22, 1980 , 42 y.o.   MRN: [id number]   Chief Complaint  Patient presents with   Weight Check    HPI  Pt presents today for weight check. She is currently taking Phentermine 15-92 MG. She reports compliance with meds. She reports no specific questions or concerns.   She reports her mother had an aneurysm 2 weeks ago. She has had a lot of stress on her lately getting her mother to & from doctors appointments. She admits she has not been exercising regularly.      Past Medical History:  Diagnosis Date   Heart murmur    Seasonal allergies      Family History  Problem Relation Age of Onset   Hypertension Mother    Hypertension Father    Diabetes Father    Breast cancer Maternal Aunt    Diabetes Maternal Aunt    Hypertension Maternal Aunt    Cancer Maternal Aunt        breast cancer   Hypertension Maternal Grandmother    Diabetes Maternal Grandmother    Cancer Maternal Grandfather        prostate   Cancer Paternal Grandmother        stomach     Current Outpatient Medications:    ELDERBERRY PO, Take by mouth. 2 gummies per day, Disp: , Rfl:    levocetirizine (XYZAL) 5 MG tablet, Take 1 tablet (5 mg total) by mouth every evening., Disp: 90 tablet, Rfl: 1   levonorgestrel (MIRENA) 20 MCG/DAY IUD, 1 each by Intrauterine route once., Disp: , Rfl:    montelukast (SINGULAIR) 10 MG tablet, TAKE 1 TABLET(10 MG) BY MOUTH DAILY, Disp: 90 tablet, Rfl: 1   Multiple Vitamins-Minerals (MULTIVITAMIN WITH MINERALS) tablet, Take 1 tablet by mouth daily., Disp: , Rfl:    Phentermine-Topiramate (QSYMIA) 15-92 MG CP24, Take 15 mg by mouth daily., Disp: 30 capsule, Rfl: 2   tirzepatide (ZEPBOUND) 2.5  MG/0.5ML Pen, Inject 2.5 mg into the skin once a week., Disp: 2 mL, Rfl: 0   Vitamin D, Ergocalciferol, (DRISDOL) 1.25 MG (50000 UNIT) CAPS capsule, TAKE ONE CAPSULE BY MOUTH EVERY TUESDAY AND FRIDAY, Disp: 25 capsule, Rfl: 1   No Known Allergies   Review of Systems  Constitutional: Negative.   Respiratory: Negative.    Cardiovascular: Negative.   Gastrointestinal: Negative.   Musculoskeletal: Negative.   Skin: Negative.   Neurological: Negative.   Psychiatric/Behavioral: Negative.       Today's Vitals   07/14/22 1022  BP: 118/78  Pulse: 81  Temp: 97.9 F (36.6 C)  SpO2: 98%  Weight: 236 lb 12.8 oz (107.4 kg)  Height: 5\' 3"  (1.6 m)   Body mass index is 41.95 kg/m.  Wt Readings from Last 3 Encounters:  07/14/22 236 lb 12.8 oz (107.4 kg)  02/14/22 239 lb 12.8 oz (108.8 kg)  12/07/21 239 lb 12.8 oz (108.8 kg)    Objective:  Physical Exam Vitals and nursing note reviewed.  Constitutional:      Appearance: Normal appearance.  HENT:     Head: Normocephalic and atraumatic.     Nose:     Comments: Masked     Mouth/Throat:  Comments: Masked  Eyes:     Extraocular Movements: Extraocular movements intact.  Cardiovascular:     Rate and Rhythm: Normal rate and regular rhythm.     Heart sounds: Normal heart sounds.  Pulmonary:     Effort: Pulmonary effort is normal.     Breath sounds: Normal breath sounds.  Musculoskeletal:     Cervical back: Normal range of motion.  Skin:    General: Skin is warm.  Neurological:     General: No focal deficit present.     Mental Status: She is alert.  Psychiatric:        Mood and Affect: Mood normal.        Behavior: Behavior normal.     Assessment And Plan:     1. Class 3 severe obesity due to excess calories without serious comorbidity with body mass index (BMI) of 40.0 to 44.9 in adult She will c/w Qsymia for now. She is hesitant to stop; however, it has not been effective. Will send Zepbound to see if her insurance covers  it.  She denies having any family history of thyroid cancer. Ultimately, she is encouraged to strive for BMI less than 30 to decrease cardiac risk. Advised to aim for at least 150 minutes of exercise per week.   -CMP14+EGFR - Insulin, random(561)  2. Non-seasonal allergic rhinitis due to pollen Comments: Chronic, controlled with montelukast 10mg  daily and Xyzal. She will c/w Qsymia for now. She is hesitant to stop; however, it has not been effective.      Patient was given opportunity to ask questions. Patient verbalized understanding of the plan and was able to repeat key elements of the plan. All questions were answered to their satisfaction.   I, Gwynneth Alimentobyn N Ayo Smoak, MD, have reviewed all documentation for this visit. The documentation on 07/14/22 for the exam, diagnosis, procedures, and orders are all accurate and complete.   IF YOU HAVE BEEN REFERRED TO A SPECIALIST, IT MAY TAKE 1-2 WEEKS TO SCHEDULE/PROCESS THE REFERRAL. IF YOU HAVE NOT HEARD FROM US/SPECIALIST IN TWO WEEKS, PLEASE GIVE US A CALL AT [phone].   THE PATIENT IS ENCOURAGED TO PRACTICE SOCIAL DISTANCING DUE TO THE COVID-19 PANDEMIC.

## 2022-07-15 ENCOUNTER — Encounter: Payer: Self-pay | Admitting: Internal Medicine

## 2022-07-15 LAB — CMP14+EGFR
ALT: 18 IU/L (ref 0–32)
AST: 15 IU/L (ref 0–40)
Albumin/Globulin Ratio: 1.3 (ref 1.2–2.2)
Albumin: 4.1 g/dL (ref 3.9–4.9)
Alkaline Phosphatase: 74 IU/L (ref 44–121)
BUN/Creatinine Ratio: 12 (ref 9–23)
BUN: 11 mg/dL (ref 6–24)
Bilirubin Total: 0.3 mg/dL (ref 0.0–1.2)
CO2: 17 mmol/L — ABNORMAL LOW (ref 20–29)
Calcium: 9.3 mg/dL (ref 8.7–10.2)
Chloride: 108 mmol/L — ABNORMAL HIGH (ref 96–106)
Creatinine, Ser: 0.9 mg/dL (ref 0.57–1.00)
Globulin, Total: 3.2 g/dL (ref 1.5–4.5)
Glucose: 92 mg/dL (ref 70–99)
Potassium: 4.3 mmol/L (ref 3.5–5.2)
Sodium: 143 mmol/L (ref 134–144)
Total Protein: 7.3 g/dL (ref 6.0–8.5)
eGFR: 82 mL/min/{1.73_m2} (ref >59)

## 2022-07-15 LAB — INSULIN, RANDOM: INSULIN: 10.2 u[IU]/mL (ref 2.6–24.9)

## 2022-09-12 ENCOUNTER — Ambulatory Visit: Payer: PRIVATE HEALTH INSURANCE | Admitting: Internal Medicine

## 2023-02-16 ENCOUNTER — Ambulatory Visit: Payer: PRIVATE HEALTH INSURANCE | Admitting: Obstetrics & Gynecology

## 2023-03-06 ENCOUNTER — Encounter: Payer: Self-pay | Admitting: Internal Medicine

## 2023-03-06 ENCOUNTER — Ambulatory Visit (INDEPENDENT_AMBULATORY_CARE_PROVIDER_SITE_OTHER): Payer: Self-pay | Admitting: Internal Medicine

## 2023-03-06 VITALS — BP 110/82 | HR 80 | Temp 98.2°F | Ht 63.0 in | Wt 242.0 lb

## 2023-03-06 DIAGNOSIS — E66813 Obesity, class 3: Secondary | ICD-10-CM

## 2023-03-06 DIAGNOSIS — Z23 Encounter for immunization: Secondary | ICD-10-CM | POA: Diagnosis not present

## 2023-03-06 DIAGNOSIS — Z6841 Body Mass Index (BMI) 40.0 and over, adult: Secondary | ICD-10-CM | POA: Diagnosis not present

## 2023-03-06 DIAGNOSIS — Z0000 Encounter for general adult medical examination without abnormal findings: Secondary | ICD-10-CM | POA: Diagnosis not present

## 2023-03-06 NOTE — Progress Notes (Signed)
I,Victoria T Deloria Lair, CMA,acting as a Neurosurgeon for Gwynneth Aliment, MD.,have documented all relevant documentation on the behalf of Gwynneth Aliment, MD,as directed by  Gwynneth Aliment, MD while in the presence of Gwynneth Aliment, MD.  Subjective:    Patient ID: Andrea Mcdaniel , female    DOB: October 27, 1980 , 42 y.o.   MRN: [id number]  Chief Complaint  Patient presents with   Annual Exam    HPI  Patient presents today for annual exam.  She is followed by GYN, Dr. Lynetta Mare for her pelvic exams.  Patient reports compliance with medications and has no other issues today. She denies having any headaches, chest pain and shortness of breath.        c   Past Medical History:  Diagnosis Date   Heart murmur    Seasonal allergies      Family History  Problem Relation Age of Onset   Hypertension Mother    Hypertension Father    Diabetes Father    Breast cancer Maternal Aunt    Diabetes Maternal Aunt    Hypertension Maternal Aunt    Cancer Maternal Aunt        breast cancer   Hypertension Maternal Grandmother    Diabetes Maternal Grandmother    Cancer Maternal Grandfather        prostate   Cancer Paternal Grandmother        stomach     Current Outpatient Medications:    ELDERBERRY PO, Take by mouth. 2 gummies per day, Disp: , Rfl:    levocetirizine (XYZAL) 5 MG tablet, Take 1 tablet (5 mg total) by mouth every evening., Disp: 90 tablet, Rfl: 1   levonorgestrel (MIRENA) 20 MCG/DAY IUD, 1 each by Intrauterine route once., Disp: , Rfl:    montelukast (SINGULAIR) 10 MG tablet, TAKE 1 TABLET(10 MG) BY MOUTH DAILY, Disp: 90 tablet, Rfl: 1   Multiple Vitamins-Minerals (MULTIVITAMIN WITH MINERALS) tablet, Take 1 tablet by mouth daily., Disp: , Rfl:    Nitrofurantoin 50 MG/5ML SUSP, , Disp: , Rfl:    Vitamin D, Ergocalciferol, (DRISDOL) 1.25 MG (50000 UNIT) CAPS capsule, TAKE ONE CAPSULE BY MOUTH EVERY TUESDAY AND FRIDAY, Disp: 25 capsule, Rfl: 1   No Known Allergies    The patient  states she uses IUD for birth control. No LMP recorded. (Menstrual status: IUD).. Negative for Dysmenorrhea. Negative for: breast discharge, breast lump(s), breast pain and breast self exam. Associated symptoms include abnormal vaginal bleeding. Pertinent negatives include abnormal bleeding (hematology), anxiety, decreased libido, depression, difficulty falling sleep, dyspareunia, history of infertility, nocturia, sexual dysfunction, sleep disturbances, urinary incontinence, urinary urgency, vaginal discharge and vaginal itching. Diet regular.The patient states her exercise level is    . The patient's tobacco use is:  Social History   Tobacco Use  Smoking Status Never  Smokeless Tobacco Never  . She has been exposed to passive smoke. The patient's alcohol use is:  Social History   Substance and Sexual Activity  Alcohol Use No    Review of Systems  Constitutional: Negative.   HENT: Negative.    Eyes: Negative.   Respiratory: Negative.    Cardiovascular: Negative.   Gastrointestinal: Negative.   Endocrine: Negative.   Genitourinary: Negative.        She reports [facility] for [facility]TI. She reports being treated w/ nitrofurantoin, sx have improved.   Musculoskeletal: Negative.   Skin: Negative.   Allergic/Immunologic: Negative.   Neurological: Negative.   Hematological: Negative.  Psychiatric/Behavioral: Negative.       Today's Vitals   03/06/23 1050  BP: 110/82  Pulse: 80  Temp: 98.2 F (36.8 C)  SpO2: 98%  Weight: 242 lb (109.8 kg)  Height: 5\' 3"  (1.6 m)   Body mass index is 42.87 kg/m.  Wt Readings from Last 3 Encounters:  03/06/23 242 lb (109.8 kg)  07/14/22 236 lb 12.8 oz (107.4 kg)  02/14/22 239 lb 12.8 oz (108.8 kg)     Objective:  Physical Exam Vitals and nursing note reviewed.  Constitutional:      Appearance: Normal appearance. She is obese.  HENT:     Head: Normocephalic and atraumatic.     Right Ear: Tympanic membrane, ear canal and  external ear normal.     Left Ear: Tympanic membrane, ear canal and external ear normal.     Nose: Nose normal.     Mouth/Throat:     Mouth: Mucous membranes are moist.     Pharynx: Oropharynx is clear.  Eyes:     Extraocular Movements: Extraocular movements intact.     Conjunctiva/sclera: Conjunctivae normal.     Pupils: Pupils are equal, round, and reactive to light.  Cardiovascular:     Rate and Rhythm: Normal rate and regular rhythm.     Pulses: Normal pulses.     Heart sounds: Normal heart sounds.  Pulmonary:     Effort: Pulmonary effort is normal.     Breath sounds: Normal breath sounds.  Chest:  Breasts:    Tanner Score is 5.     Right: Normal.     Left: Normal.  Abdominal:     General: Bowel sounds are normal.     Palpations: Abdomen is soft.     Comments: Obese, soft  Genitourinary:    Comments: deferred Musculoskeletal:        General: Normal range of motion.     Cervical back: Normal range of motion and neck supple.  Skin:    General: Skin is warm and dry.  Neurological:     General: No focal deficit present.     Mental Status: She is alert and oriented to person, place, and time.  Psychiatric:        Mood and Affect: Mood normal.        Behavior: Behavior normal.         Assessment And Plan:     Annual physical exam Assessment & Plan: A full exam was performed.  Importance of monthly self breast exams was discussed with the patient.  She is advised to get 30-45 minutes of regular exercise, no less than four to five days per week. Both weight-bearing and aerobic exercises are recommended.  She is advised to follow a healthy diet with at least six fruits/veggies per day, decrease intake of red meat and other saturated fats and to increase fish intake to twice weekly.  Meats/fish should not be fried -- baked, boiled or broiled is preferable. It is also important to cut back on your sugar intake.  Be sure to read labels - try to avoid anything with added sugar,  high fructose corn syrup or other sweeteners.  If you must use a sweetener, you can try stevia or monkfruit.  It is also important to avoid artificially sweetened foods/beverages and diet drinks. Lastly, wear SPF 50 sunscreen on exposed skin and when in direct sunlight for an extended period of time.  Be sure to avoid fast food restaurants and aim for at least 60  ounces of water daily.      Orders: -     CBC -     CMP14+EGFR -     Lipid panel -     Hemoglobin A1c -     TSH  Class 3 severe obesity due to excess calories without serious comorbidity with body mass index (BMI) of 40.0 to 44.9 in adult [facility]) Assessment & Plan: She is encouraged to initially strive for BMI less than 35 to decrease cardiac risk. Advised to aim for at least 150 minutes of exercise per week.    Immunization due -     Flu vaccine trivalent PF, 6mos and older(Flulaval,Afluria,Fluarix,Fluzone)  She is encouraged to strive for BMI less than 30 to decrease cardiac risk. Advised to aim for at least 150 minutes of exercise per week.    Return for 1 year HM . Patient was given opportunity to ask questions. Patient verbalized understanding of the plan and was able to repeat key elements of the plan. All questions were answered to their satisfaction.   I, Gwynneth Aliment, MD, have reviewed all documentation for this visit. The documentation on 03/06/23 for the exam, diagnosis, procedures, and orders are all accurate and complete.

## 2023-03-06 NOTE — Assessment & Plan Note (Signed)

## 2023-03-06 NOTE — Assessment & Plan Note (Signed)
She is encouraged to initially strive for BMI less than 35 to decrease cardiac risk. Advised to aim for at least 150 minutes of exercise per week.

## 2023-03-06 NOTE — Patient Instructions (Addendum)
Chia seed pudding Bone broth  Health Maintenance, Female Adopting a healthy lifestyle and getting preventive care are important in promoting health and wellness. Ask your health care provider about: The right schedule for you to have regular tests and exams. Things you can do on your own to prevent diseases and keep yourself healthy. What should I know about diet, weight, and exercise? Eat a healthy diet  Eat a diet that includes plenty of vegetables, fruits, low-fat dairy products, and lean protein. Do not eat a lot of foods that are high in solid fats, added sugars, or sodium. Maintain a healthy weight Body mass index (BMI) is used to identify weight problems. It estimates body fat based on height and weight. Your health care provider can help determine your BMI and help you achieve or maintain a healthy weight. Get regular exercise Get regular exercise. This is one of the most important things you can do for your health. Most adults should: Exercise for at least 150 minutes each week. The exercise should increase your heart rate and make you sweat (moderate-intensity exercise). Do strengthening exercises at least twice a week. This is in addition to the moderate-intensity exercise. Spend less time sitting. Even light physical activity can be beneficial. Watch cholesterol and blood lipids Have your blood tested for lipids and cholesterol at 43 years of age, then have this test every 5 years. Have your cholesterol levels checked more often if: Your lipid or cholesterol levels are high. You are older than 43 years of age. You are at high risk for heart disease. What should I know about cancer screening? Depending on your health history and family history, you may need to have cancer screening at various ages. This may include screening for: Breast cancer. Cervical cancer. Colorectal cancer. Skin cancer. Lung cancer. What should I know about heart disease, diabetes, and high blood  pressure? Blood pressure and heart disease High blood pressure causes heart disease and increases the risk of stroke. This is more likely to develop in people who have high blood pressure readings or are overweight. Have your blood pressure checked: Every 3-5 years if you are 84-47 years of age. Every year if you are 75 years old or older. Diabetes Have regular diabetes screenings. This checks your fasting blood sugar level. Have the screening done: Once every three years after age 74 if you are at a normal weight and have a low risk for diabetes. More often and at a younger age if you are overweight or have a high risk for diabetes. What should I know about preventing infection? Hepatitis B If you have a higher risk for hepatitis B, you should be screened for this virus. Talk with your health care provider to find out if you are at risk for hepatitis B infection. Hepatitis C Testing is recommended for: Everyone born from [birth year] through 1965. Anyone with known risk factors for hepatitis C. Sexually transmitted infections (STIs) Get screened for STIs, including gonorrhea and chlamydia, if: You are sexually active and are younger than 43 years of age. You are older than 43 years of age and your health care provider tells you that you are at risk for this type of infection. Your sexual activity has changed since you were last screened, and you are at increased risk for chlamydia or gonorrhea. Ask your health care provider if you are at risk. Ask your health care provider about whether you are at high risk for HIV. Your health care provider may recommend a  prescription medicine to help prevent HIV infection. If you choose to take medicine to prevent HIV, you should first get tested for HIV. You should then be tested every 3 months for as long as you are taking the medicine. Pregnancy If you are about to stop having your period (premenopausal) and you may become pregnant, seek counseling before you  get pregnant. Take 400 to 800 micrograms (mcg) of folic acid every day if you become pregnant. Ask for birth control (contraception) if you want to prevent pregnancy. Osteoporosis and menopause Osteoporosis is a disease in which the bones lose minerals and strength with aging. This can result in bone fractures. If you are 52 years old or older, or if you are at risk for osteoporosis and fractures, ask your health care provider if you should: Be screened for bone loss. Take a calcium or vitamin D supplement to lower your risk of fractures. Be given hormone replacement therapy (HRT) to treat symptoms of menopause. Follow these instructions at home: Alcohol use Do not drink alcohol if: Your health care provider tells you not to drink. You are pregnant, may be pregnant, or are planning to become pregnant. If you drink alcohol: Limit how much you have to: 0-1 drink a day. Know how much alcohol is in your drink. In the [place] one drink equals one 12 oz bottle of beer (355 mL), one 5 oz glass of wine (148 mL), or one 1 oz glass of hard liquor (44 mL). Lifestyle Do not use any products that contain nicotine or tobacco. These products include cigarettes, chewing tobacco, and vaping devices, such as e-cigarettes. If you need help quitting, ask your health care provider. Do not use street drugs. Do not share needles. Ask your health care provider for help if you need support or information about quitting drugs. General instructions Schedule regular health, dental, and eye exams. Stay current with your vaccines. Tell your health care provider if: You often feel depressed. You have ever been abused or do not feel safe at home. Summary Adopting a healthy lifestyle and getting preventive care are important in promoting health and wellness. Follow your health care provider's instructions about healthy diet, exercising, and getting tested or screened for diseases. Follow your health care provider's  instructions on monitoring your cholesterol and blood pressure. This information is not intended to replace advice given to you by your health care provider. Make sure you discuss any questions you have with your health care provider. Document Revised: 08/10/2020 Document Reviewed: 08/10/2020 Elsevier Patient Education  2024 [employer].

## 2023-03-07 LAB — CBC
Hematocrit: 41.2 % (ref 34.0–46.6)
Hemoglobin: 13.4 g/dL (ref 11.1–15.9)
MCH: 28.5 pg (ref 26.6–33.0)
MCHC: 32.5 g/dL (ref 31.5–35.7)
MCV: 88 fL (ref 79–97)
Platelets: 256 10*3/uL (ref 150–450)
RBC: 4.71 x10E6/uL (ref 3.77–5.28)
RDW: 12.6 % (ref 11.7–15.4)
WBC: 6.1 10*3/uL (ref 3.4–10.8)

## 2023-03-07 LAB — LIPID PANEL
Chol/HDL Ratio: 2.5 {ratio} (ref 0.0–4.4)
Cholesterol, Total: 157 mg/dL (ref 100–199)
HDL: 64 mg/dL (ref >39)
LDL Chol Calc (NIH): 82 mg/dL (ref 0–99)
Triglycerides: 51 mg/dL (ref 0–149)
VLDL Cholesterol Cal: 11 mg/dL (ref 5–40)

## 2023-03-07 LAB — CMP14+EGFR
ALT: 27 [IU]/L (ref 0–32)
AST: 25 [IU]/L (ref 0–40)
Albumin: 4.1 g/dL (ref 3.9–4.9)
Alkaline Phosphatase: 69 [IU]/L (ref 44–121)
BUN/Creatinine Ratio: 9 (ref 9–23)
BUN: 7 mg/dL (ref 6–24)
Bilirubin Total: 0.2 mg/dL (ref 0.0–1.2)
CO2: 24 mmol/L (ref 20–29)
Calcium: 9.1 mg/dL (ref 8.7–10.2)
Chloride: 102 mmol/L (ref 96–106)
Creatinine, Ser: 0.8 mg/dL (ref 0.57–1.00)
Globulin, Total: 3 g/dL (ref 1.5–4.5)
Glucose: 88 mg/dL (ref 70–99)
Potassium: 4.2 mmol/L (ref 3.5–5.2)
Sodium: 138 mmol/L (ref 134–144)
Total Protein: 7.1 g/dL (ref 6.0–8.5)
eGFR: 94 mL/min/{1.73_m2} (ref >59)

## 2023-03-07 LAB — HEMOGLOBIN A1C
Est. average glucose Bld gHb Est-mCnc: 117 mg/dL
Hgb A1c MFr Bld: 5.7 % — ABNORMAL HIGH (ref 4.8–5.6)

## 2023-03-07 LAB — TSH: TSH: 1.22 u[IU]/mL (ref 0.450–4.500)

## 2023-03-13 ENCOUNTER — Other Ambulatory Visit: Payer: Self-pay | Admitting: Internal Medicine

## 2023-03-13 DIAGNOSIS — Z1231 Encounter for screening mammogram for malignant neoplasm of breast: Secondary | ICD-10-CM

## 2023-04-04 ENCOUNTER — Other Ambulatory Visit (HOSPITAL_COMMUNITY)
Admission: RE | Admit: 2023-04-04 | Discharge: 2023-04-04 | Disposition: A | Discharge status: Home / Self Care | Payer: No Typology Code available for payment source | Source: Ambulatory Visit | Attending: Obstetrics & Gynecology | Admitting: Obstetrics & Gynecology

## 2023-04-04 ENCOUNTER — Encounter: Payer: Self-pay | Admitting: Obstetrics & Gynecology

## 2023-04-04 ENCOUNTER — Ambulatory Visit (INDEPENDENT_AMBULATORY_CARE_PROVIDER_SITE_OTHER): Payer: No Typology Code available for payment source | Admitting: Obstetrics & Gynecology

## 2023-04-04 VITALS — BP 124/83 | HR 69 | Ht 60.75 in | Wt 241.0 lb

## 2023-04-04 DIAGNOSIS — Z01419 Encounter for gynecological examination (general) (routine) without abnormal findings: Secondary | ICD-10-CM

## 2023-04-04 DIAGNOSIS — Z975 Presence of (intrauterine) contraceptive device: Secondary | ICD-10-CM | POA: Diagnosis not present

## 2023-04-04 DIAGNOSIS — Z1339 Encounter for screening examination for other mental health and behavioral disorders: Secondary | ICD-10-CM

## 2023-04-04 DIAGNOSIS — N898 Other specified noninflammatory disorders of vagina: Secondary | ICD-10-CM | POA: Diagnosis not present

## 2023-04-04 DIAGNOSIS — B3731 Acute candidiasis of vulva and vagina: Secondary | ICD-10-CM

## 2023-04-04 NOTE — Progress Notes (Signed)
 GYNECOLOGY ANNUAL PREVENTATIVE CARE ENCOUNTER NOTE  History:     Andrea Mcdaniel is a 42 y.o. [phone] female here for a routine annual gynecologic exam.  Current complaints: none.   Denies abnormal vaginal bleeding, discharge, pelvic pain, problems with intercourse or other gynecologic concerns.    Gynecologic History Patient's last menstrual period was 03/19/2023 (exact date). Contraception: Mirena  IUD placed 07/04/2019 Last Pap: 07/04/2019. Result was normal with negative HPV Last Mammogram: 03/11/2022.  Result was normal. Nex one scheduled 04/2023.  Obstetric History OB History  Gravida Para Term Preterm AB Living  3 3 2 1  0 3  SAB IAB Ectopic Multiple Live Births  0 0 0 0     # Outcome Date GA Lbr Len/2nd Weight Sex Type Anes PTL Lv  3 Term 09/21/09 [redacted]w[redacted]d  7 lb 10 oz (3.459 kg) M Vag-Spont EPI    2 Term 03/26/08 [redacted]w[redacted]d  7 lb 8 oz (3.402 kg) M Vag-Spont     1 Preterm 09/23/06 [redacted]w[redacted]d  3 lb 6.3 oz (1.539 kg) F Vag-Spont       Past Medical History:  Diagnosis Date   Heart murmur    Seasonal allergies     History reviewed. No pertinent surgical history.  Current Outpatient Medications on File Prior to Visit  Medication Sig Dispense Refill   ELDERBERRY PO Take by mouth. 2 gummies per day     levocetirizine (XYZAL ) 5 MG tablet Take 1 tablet (5 mg total) by mouth every evening. 90 tablet 1   levonorgestrel  (MIRENA ) 20 MCG/DAY IUD 1 each by Intrauterine route once.     montelukast  (SINGULAIR ) 10 MG tablet TAKE 1 TABLET(10 MG) BY MOUTH DAILY 90 tablet 1   Multiple Vitamins-Minerals (MULTIVITAMIN WITH MINERALS) tablet Take 1 tablet by mouth daily.     Nitrofurantoin 50 MG/5ML SUSP      Vitamin D , Ergocalciferol , (DRISDOL ) 1.25 MG (50000 UNIT) CAPS capsule TAKE ONE CAPSULE BY MOUTH EVERY TUESDAY AND FRIDAY 25 capsule 1   No current facility-administered medications on file prior to visit.    No Known Allergies  Social History:  reports that she has never smoked. She has never  used smokeless tobacco. She reports that she does not drink alcohol and does not use drugs.  Family History  Problem Relation Age of Onset   Hypertension Mother    Hypertension Father    Diabetes Father    Breast cancer Maternal Aunt    Diabetes Maternal Aunt    Hypertension Maternal Aunt    Cancer Maternal Aunt        breast cancer   Hypertension Maternal Grandmother    Diabetes Maternal Grandmother    Cancer Maternal Grandfather        prostate   Cancer Paternal Grandmother        stomach    The following portions of the patient's history were reviewed and updated as appropriate: allergies, current medications, past family history, past medical history, past social history, past surgical history and problem list.  Review of Systems Pertinent items noted in HPI and remainder of comprehensive ROS otherwise negative.  Physical Exam:  BP 124/83   Pulse 69   Ht 5' 0.75 (1.543 m)   Wt 241 lb (109.3 kg)   LMP 03/19/2023 (Exact Date)   BMI 45.91 kg/m  CONSTITUTIONAL: Well-developed, well-nourished female in no acute distress.  HENT:  Normocephalic, atraumatic, External right and left ear normal.  EYES: Conjunctivae and EOM are normal. Pupils are equal, round, and  reactive to light. No scleral icterus.  NECK: Normal range of motion, supple, no masses.  Normal thyroid.  SKIN: Skin is warm and dry. No rash noted. Not diaphoretic. No erythema. No pallor. MUSCULOSKELETAL: Normal range of motion. No tenderness.  No cyanosis, clubbing, or edema. NEUROLOGIC: Alert and oriented to person, place, and time. Normal reflexes, muscle tone coordination.  PSYCHIATRIC: Normal mood and affect. Normal behavior. Normal judgment and thought content. CARDIOVASCULAR: Normal heart rate noted, regular rhythm RESPIRATORY: Clear to auscultation bilaterally. Effort and breath sounds normal, no problems with respiration noted. BREASTS: Symmetric in size. No masses, tenderness, skin changes, nipple drainage,  or lymphadenopathy bilaterally. Performed in the presence of a chaperone. ABDOMEN: Soft, no distention noted.  No tenderness, rebound or guarding.  PELVIC: Normal appearing external genitalia and urethral meatus; normal appearing vaginal mucosa and cervix. Mirena  strings seen.  Yellowl vaginal discharge noted, testing sample obtained.  Pap smear obtained.  Normal uterine size, no other palpable masses, no uterine or adnexal tenderness.  Performed in the presence of a chaperone.   Assessment and Plan:    1. Vaginal discharge - Cervicovaginal ancillary only( [place]) done, will follow up results and manage accordingly.  2. MIrena  IUD (intrauterine device) in place  since 07/04/2019 Satisfied with method, no concerns. Can stay in place until 2029.  3. Well woman exam with routine gynecological exam (Primary) - Cytology - PAP Will follow up results of pap smear and manage accordingly. Mammogram already scheduled for breast cancer screening. Routine preventative health maintenance measures emphasized. Please refer to After Visit Summary for other counseling recommendations.      GLORIS HUGGER, MD, FACOG Obstetrician & Gynecologist, [facility] for [employer], [facility]

## 2023-04-06 LAB — CERVICOVAGINAL ANCILLARY ONLY
Bacterial Vaginitis (gardnerella): NEGATIVE
Candida Glabrata: NEGATIVE
Candida Vaginitis: POSITIVE — AB
Comment: NEGATIVE
Comment: NEGATIVE
Comment: NEGATIVE
Comment: NEGATIVE
Trichomonas: NEGATIVE

## 2023-04-07 MED ORDER — FLUCONAZOLE 150 MG PO TABS: 150.0000 mg | ORAL_TABLET | Freq: Once | ORAL | 3 refills | Status: AC

## 2023-04-07 NOTE — Addendum Note (Signed)
 Addended by: Jaynie Collins A on: 04/07/2023 07:48 AM   Modules accepted: Orders

## 2023-04-10 LAB — CYTOLOGY - PAP
Comment: NEGATIVE
Diagnosis: NEGATIVE
High risk HPV: NEGATIVE

## 2023-04-14 ENCOUNTER — Ambulatory Visit: Payer: PRIVATE HEALTH INSURANCE

## 2023-04-21 ENCOUNTER — Ambulatory Visit
Admission: RE | Admit: 2023-04-21 | Discharge: 2023-04-21 | Disposition: A | Discharge status: Home / Self Care | Payer: PRIVATE HEALTH INSURANCE | Source: Ambulatory Visit | Attending: Internal Medicine | Admitting: Internal Medicine

## 2023-04-21 DIAGNOSIS — Z1231 Encounter for screening mammogram for malignant neoplasm of breast: Secondary | ICD-10-CM

## 2023-07-10 ENCOUNTER — Other Ambulatory Visit: Payer: Self-pay | Admitting: Internal Medicine

## 2023-07-10 NOTE — Telephone Encounter (Signed)
 Copied from CRM [id number]. Topic: Clinical - Medication Refill >> Jul 10, 2023  9:07 AM Patsy Lager T wrote: Most Recent Primary Care Visit:  Provider: Dorothyann Peng  Department: [facility]  Visit Type: PHYSICAL  Date: 03/06/2023  Medication: Vitamin D, Ergocalciferol, (DRISDOL) 1.25 MG (50000 UNIT) CAPS capsule  Has the patient contacted their pharmacy? Yes Pharmacy said sript was expired  Is this the correct pharmacy for this prescription? Yes If no, delete pharmacy and type the correct one.  This is the patient's preferred pharmacy:  [id number] [place], [place] - [phone] AT [facility] OF [facility] & [address] [place] [address] Phone: [phone] Fax: [phone]   Has the prescription been filled recently? No  Is the patient out of the medication? Yes  Has the patient been seen for an appointment in the last year OR does the patient have an upcoming appointment? Yes  Can we respond through MyChart? No  Agent: Please be advised that Rx refills may take up to 3 business days. We ask that you follow-up with your pharmacy.

## 2024-03-06 ENCOUNTER — Ambulatory Visit: Payer: PRIVATE HEALTH INSURANCE | Admitting: Internal Medicine

## 2024-03-06 ENCOUNTER — Encounter: Payer: Self-pay | Admitting: Internal Medicine

## 2024-03-06 VITALS — BP 128/80 | HR 80 | Temp 98.4°F | Ht 60.0 in | Wt 254.4 lb

## 2024-03-06 DIAGNOSIS — Z6841 Body Mass Index (BMI) 40.0 and over, adult: Secondary | ICD-10-CM

## 2024-03-06 DIAGNOSIS — Z0000 Encounter for general adult medical examination without abnormal findings: Secondary | ICD-10-CM | POA: Diagnosis not present

## 2024-03-06 DIAGNOSIS — R232 Flushing: Secondary | ICD-10-CM | POA: Diagnosis not present

## 2024-03-06 DIAGNOSIS — Z23 Encounter for immunization: Secondary | ICD-10-CM

## 2024-03-06 NOTE — Assessment & Plan Note (Signed)
 A full exam was performed.  Importance of monthly self breast exams was discussed with the patient.  She is advised to get 3045 minutes of regular exercise, no less than four to five days per week. Both weight-bearing and aerobic exercises are recommended.  She is advised to follow a healthy diet with at least six fruits/veggies per day, decrease intake of red meat and other saturated fats and to increase fish intake to twice weekly.  Meats/fish should not be fried -- baked, boiled or broiled is preferable. It is also important to cut back on your sugar intake.  Be sure to read labels - try to avoid anything with added sugar, high fructose corn syrup or other sweeteners.  If you must use a sweetener, you can try stevia or monkfruit.  It is also important to avoid artificially sweetened foods/beverages and diet drinks. Lastly, wear SPF 50 sunscreen on exposed skin and when in direct sunlight for an extended period of time.  Be sure to avoid fast food restaurants and aim for at least 60 ounces of water daily.    - Continue regular physical activity, including daily walking. - Incorporate strength training to build muscle mass. - Ensure regular breast self-exams and schedule mammogram. - Start over-the-counter vitamin D  5000 IU capsule.

## 2024-03-06 NOTE — Progress Notes (Signed)
 I,Victoria T Emmitt, CMA,acting as a neurosurgeon for Catheryn LOISE Slocumb, MD.,have documented all relevant documentation on the behalf of Catheryn LOISE Slocumb, MD,as directed by  Catheryn LOISE Slocumb, MD while in the presence of Catheryn LOISE Slocumb, MD.  Subjective:    Patient ID: Andrea Mcdaniel , female    DOB: 1980-11-14 , 43 y.o.   MRN: [id number]  Chief Complaint  Patient presents with   Annual Exam    Patient presents today for annual exam. GYN : Dr. Araceli -  information is in Mychart. Patient states she has no other questions or concerns today. Letter sent for mammogram result.     HPI Discussed the use of AI scribe software for clinical note transcription with the patient, who gave verbal consent to proceed.  History of Present Illness Andrea Mcdaniel is a 43 year old female who presents for an annual physical exam.  She received a flu shot today and is currently using a Mirena  IUD, experiencing regular menstrual cycles with the last cycle starting on November 17th. She experiences hot flashes and identifies potential dietary triggers such as alcohol and sugar.  She is physically active, walking 30 to 40 minutes daily with her 'other baby.' She has a gym membership but finds it challenging to attend due to her children's schedules. She has not yet incorporated strength training into her routine.  Her current medications include Xyzal  and Singulair . She is not currently taking over-the-counter vitamin D  but plans to start. She reports good bowel function with daily movements and no issues with her hips or circulation.  She performs regular breast self-exams and is waiting to schedule her next mammogram, as she has not received a reminder from the breast center.  Her A1c was slightly elevated last year.  Past Medical History:  Diagnosis Date   Heart murmur    Seasonal allergies      Family History  Problem Relation Age of Onset   Hypertension Mother    Hypertension Father    Diabetes Father     Breast cancer Maternal Aunt    Diabetes Maternal Aunt    Hypertension Maternal Aunt    Cancer Maternal Aunt        breast cancer   Hypertension Maternal Grandmother    Diabetes Maternal Grandmother    Cancer Maternal Grandfather        prostate   Cancer Paternal Grandmother        stomach     Current Outpatient Medications:    ELDERBERRY PO, Take by mouth. 2 gummies per day, Disp: , Rfl:    levocetirizine (XYZAL ) 5 MG tablet, Take 1 tablet (5 mg total) by mouth every evening., Disp: 90 tablet, Rfl: 1   levonorgestrel  (MIRENA ) 20 MCG/DAY IUD, 1 each by Intrauterine route once., Disp: , Rfl:    montelukast  (SINGULAIR ) 10 MG tablet, TAKE 1 TABLET(10 MG) BY MOUTH DAILY, Disp: 90 tablet, Rfl: 1   Multiple Vitamins-Minerals (MULTIVITAMIN WITH MINERALS) tablet, Take 1 tablet by mouth daily., Disp: , Rfl:    No Known Allergies    The patient states she uses IUD for birth control. No LMP recorded. (Menstrual status: IUD).. Negative for Dysmenorrhea. Negative for: breast discharge, breast lump(s), breast pain and breast self exam. Associated symptoms include abnormal vaginal bleeding. Pertinent negatives include abnormal bleeding (hematology), anxiety, decreased libido, depression, difficulty falling sleep, dyspareunia, history of infertility, nocturia, sexual dysfunction, sleep disturbances, urinary incontinence, urinary urgency, vaginal discharge and vaginal itching. Diet regular.The patient states her exercise level  is  moderate - walking.   . The patient's tobacco use is:  Social History   Tobacco Use  Smoking Status Never  Smokeless Tobacco Never  . She has been exposed to passive smoke. The patient's alcohol use is:  Social History   Substance and Sexual Activity  Alcohol Use No   Review of Systems  Constitutional: Negative.   HENT: Negative.    Eyes: Negative.   Respiratory: Negative.    Cardiovascular: Negative.   Gastrointestinal: Negative.   Endocrine: Negative.    Genitourinary: Negative.   Musculoskeletal: Negative.   Skin: Negative.   Allergic/Immunologic: Negative.   Neurological: Negative.   Hematological: Negative.   Psychiatric/Behavioral: Negative.       Today's Vitals   03/06/24 0930  BP: 128/80  Pulse: 80  Temp: 98.4 F (36.9 C)  Weight: 254 lb 6.4 oz (115.4 kg)  Height: 5' (1.524 m)   Body mass index is 49.68 kg/m.  Wt Readings from Last 3 Encounters:  03/06/24 254 lb 6.4 oz (115.4 kg)  04/04/23 241 lb (109.3 kg)  03/06/23 242 lb (109.8 kg)     Objective:  Physical Exam Vitals and nursing note reviewed.  Constitutional:      Appearance: Normal appearance. She is obese.  HENT:     Head: Normocephalic and atraumatic.     Right Ear: Tympanic membrane, ear canal and external ear normal.     Left Ear: Tympanic membrane, ear canal and external ear normal.     Nose: Nose normal.     Mouth/Throat:     Mouth: Mucous membranes are moist.     Pharynx: Oropharynx is clear.  Eyes:     Extraocular Movements: Extraocular movements intact.     Conjunctiva/sclera: Conjunctivae normal.     Pupils: Pupils are equal, round, and reactive to light.  Cardiovascular:     Rate and Rhythm: Normal rate and regular rhythm.     Pulses: Normal pulses.     Heart sounds: Normal heart sounds.  Pulmonary:     Effort: Pulmonary effort is normal.     Breath sounds: Normal breath sounds.  Chest:  Breasts:    Tanner Score is 5.     Right: Normal.     Left: Normal.  Abdominal:     General: Bowel sounds are normal.     Palpations: Abdomen is soft.     Comments: Obese, soft  Genitourinary:    Comments: deferred Musculoskeletal:        General: Normal range of motion.     Cervical back: Normal range of motion and neck supple.  Skin:    General: Skin is warm and dry.  Neurological:     General: No focal deficit present.     Mental Status: She is alert and oriented to person, place, and time.  Psychiatric:        Mood and Affect: Mood  normal.        Behavior: Behavior normal.         Assessment And Plan:     Annual physical exam Assessment & Plan: A full exam was performed.  Importance of monthly self breast exams was discussed with the patient.  She is advised to get 3045 minutes of regular exercise, no less than four to five days per week. Both weight-bearing and aerobic exercises are recommended.  She is advised to follow a healthy diet with at least six fruits/veggies per day, decrease intake of red meat and other saturated fats and  to increase fish intake to twice weekly.  Meats/fish should not be fried -- baked, boiled or broiled is preferable. It is also important to cut back on your sugar intake.  Be sure to read labels - try to avoid anything with added sugar, high fructose corn syrup or other sweeteners.  If you must use a sweetener, you can try stevia or monkfruit.  It is also important to avoid artificially sweetened foods/beverages and diet drinks. Lastly, wear SPF 50 sunscreen on exposed skin and when in direct sunlight for an extended period of time.  Be sure to avoid fast food restaurants and aim for at least 60 ounces of water daily.    - Continue regular physical activity, including daily walking. - Incorporate strength training to build muscle mass. - Ensure regular breast self-exams and schedule mammogram. - Start over-the-counter vitamin D  5000 IU capsule.  Orders: -     CBC -     CMP14+EGFR -     Lipid panel -     Hemoglobin A1c  Hot flashes Assessment & Plan: Experiencing hot flashes, possibly related to hormonal changes. No full benefit from IUD in preventing hot flashes. - Increase intake of cruciferous vegetables to aid liver function and hormone metabolism. - Monitor for triggers such as alcohol and sugar. - Consider using frozen grapes or ice to manage night sweats.   Morbid obesity with BMI of 45.0-49.9, adult (HCC) Assessment & Plan: BMI 49.  Weight gain of 13 pounds over the past  year. Previous A1c slightly elevated. Insurance coverage for Wegovy  uncertain. - Focus on weight loss by incorporating more cruciferous vegetables into diet. - Ensure insurance information is updated for potential Wegovy  coverage. - She denies family history of thyroid cancer.  - She should consider contacting her insurance to see if Wegovy  is covered since prior auth process can be lengthy.    Immunization due -     Flu vaccine trivalent PF, 6mos and older(Flulaval,Afluria,Fluarix,Fluzone)   Return in 6 months (on 09/04/2024), or prediabetes check, for 1 year HM . Patient was given opportunity to ask questions. Patient verbalized understanding of the plan and was able to repeat key elements of the plan. All questions were answered to their satisfaction.   I, Catheryn LOISE Slocumb, MD, have reviewed all documentation for this visit. The documentation on 03/06/24 for the exam, diagnosis, procedures, and orders are all accurate and complete.

## 2024-03-06 NOTE — Patient Instructions (Signed)

## 2024-03-07 LAB — CMP14+EGFR
ALT: 11 IU/L (ref 0–32)
AST: 16 IU/L (ref 0–40)
Albumin: 4 g/dL (ref 3.9–4.9)
Alkaline Phosphatase: 61 IU/L (ref 41–116)
BUN/Creatinine Ratio: 9 (ref 9–23)
BUN: 7 mg/dL (ref 6–24)
Bilirubin Total: 0.4 mg/dL (ref 0.0–1.2)
CO2: 22 mmol/L (ref 20–29)
Calcium: 9.2 mg/dL (ref 8.7–10.2)
Chloride: 102 mmol/L (ref 96–106)
Creatinine, Ser: 0.8 mg/dL (ref 0.57–1.00)
Globulin, Total: 3 g/dL (ref 1.5–4.5)
Glucose: 91 mg/dL (ref 70–99)
Potassium: 4.4 mmol/L (ref 3.5–5.2)
Sodium: 139 mmol/L (ref 134–144)
Total Protein: 7 g/dL (ref 6.0–8.5)
eGFR: 93 mL/min/1.73 (ref >59)

## 2024-03-07 LAB — CBC
Hematocrit: 41.6 % (ref 34.0–46.6)
Hemoglobin: 13.4 g/dL (ref 11.1–15.9)
MCH: 28.6 pg (ref 26.6–33.0)
MCHC: 32.2 g/dL (ref 31.5–35.7)
MCV: 89 fL (ref 79–97)
Platelets: 257 x10E3/uL (ref 150–450)
RBC: 4.68 x10E6/uL (ref 3.77–5.28)
RDW: 12.8 % (ref 11.7–15.4)
WBC: 5.7 x10E3/uL (ref 3.4–10.8)

## 2024-03-07 LAB — LIPID PANEL
Chol/HDL Ratio: 2.4 ratio (ref 0.0–4.4)
Cholesterol, Total: 141 mg/dL (ref 100–199)
HDL: 59 mg/dL (ref >39)
LDL Chol Calc (NIH): 71 mg/dL (ref 0–99)
Triglycerides: 48 mg/dL (ref 0–149)
VLDL Cholesterol Cal: 11 mg/dL (ref 5–40)

## 2024-03-07 LAB — HEMOGLOBIN A1C
Est. average glucose Bld gHb Est-mCnc: 108 mg/dL
Hgb A1c MFr Bld: 5.4 % (ref 4.8–5.6)

## 2024-03-10 ENCOUNTER — Ambulatory Visit: Payer: Self-pay | Admitting: Internal Medicine

## 2024-03-10 NOTE — Assessment & Plan Note (Addendum)
 BMI 49.  Weight gain of 13 pounds over the past year. Previous A1c slightly elevated. Insurance coverage for Wegovy  uncertain. - Focus on weight loss by incorporating more cruciferous vegetables into diet. - Ensure insurance information is updated for potential Wegovy  coverage. - She denies family history of thyroid cancer.  - She should consider contacting her insurance to see if Wegovy  is covered since prior auth process can be lengthy.

## 2024-03-10 NOTE — Assessment & Plan Note (Signed)
 Experiencing hot flashes, possibly related to hormonal changes. No full benefit from IUD in preventing hot flashes. - Increase intake of cruciferous vegetables to aid liver function and hormone metabolism. - Monitor for triggers such as alcohol and sugar. - Consider using frozen grapes or ice to manage night sweats.

## 2024-04-08 ENCOUNTER — Other Ambulatory Visit: Payer: Self-pay | Admitting: Internal Medicine

## 2024-04-08 DIAGNOSIS — Z1231 Encounter for screening mammogram for malignant neoplasm of breast: Secondary | ICD-10-CM

## 2024-04-26 ENCOUNTER — Ambulatory Visit
Admission: RE | Admit: 2024-04-26 | Discharge: 2024-04-26 | Disposition: A | Source: Ambulatory Visit | Attending: Internal Medicine | Admitting: Internal Medicine

## 2024-04-26 DIAGNOSIS — Z1231 Encounter for screening mammogram for malignant neoplasm of breast: Secondary | ICD-10-CM

## 2024-05-07 ENCOUNTER — Other Ambulatory Visit: Payer: Self-pay | Admitting: Internal Medicine

## 2024-05-07 DIAGNOSIS — R928 Other abnormal and inconclusive findings on diagnostic imaging of breast: Secondary | ICD-10-CM

## 2024-05-14 ENCOUNTER — Ambulatory Visit: Admitting: Obstetrics & Gynecology

## 2024-05-14 ENCOUNTER — Other Ambulatory Visit

## 2024-05-14 ENCOUNTER — Encounter

## 2024-09-04 ENCOUNTER — Ambulatory Visit: Payer: Self-pay | Admitting: Internal Medicine

## 2025-03-12 ENCOUNTER — Encounter: Payer: Self-pay | Admitting: Internal Medicine
